# Patient Record
Sex: Male | Born: 1950 | Race: Black or African American | Hispanic: No | Marital: Single | Smoking: Current every day smoker
Health system: Southern US, Community
[De-identification: ages and names within clinical notes are randomized; demographics above are authoritative.]

## PROBLEM LIST (undated history)

## (undated) DIAGNOSIS — I219 Acute myocardial infarction, unspecified: Secondary | ICD-10-CM

## (undated) DIAGNOSIS — F418 Other specified anxiety disorders: Secondary | ICD-10-CM

## (undated) DIAGNOSIS — I739 Peripheral vascular disease, unspecified: Secondary | ICD-10-CM

## (undated) DIAGNOSIS — G5600 Carpal tunnel syndrome, unspecified upper limb: Secondary | ICD-10-CM

## (undated) DIAGNOSIS — M549 Dorsalgia, unspecified: Secondary | ICD-10-CM

## (undated) DIAGNOSIS — F1010 Alcohol abuse, uncomplicated: Secondary | ICD-10-CM

## (undated) HISTORY — PX: BACK SURGERY: SHX140

---

## 1991-12-21 HISTORY — PX: FEMORAL OSTEOTOMY W/ RODDING: SHX1588

## 2003-03-23 ENCOUNTER — Inpatient Hospital Stay (HOSPITAL_COMMUNITY): Admission: EM | Admit: 2003-03-23 | Discharge: 2003-03-23 | Payer: Self-pay | Admitting: Emergency Medicine

## 2003-03-23 ENCOUNTER — Encounter: Payer: Self-pay | Admitting: Emergency Medicine

## 2004-05-27 ENCOUNTER — Ambulatory Visit (HOSPITAL_COMMUNITY): Admission: RE | Admit: 2004-05-27 | Discharge: 2004-05-27 | Payer: Self-pay | Admitting: Internal Medicine

## 2004-09-04 ENCOUNTER — Ambulatory Visit: Payer: Self-pay | Admitting: Nurse Practitioner

## 2004-12-31 ENCOUNTER — Ambulatory Visit: Payer: Self-pay | Admitting: Nurse Practitioner

## 2005-08-10 ENCOUNTER — Ambulatory Visit: Payer: Self-pay | Admitting: Nurse Practitioner

## 2005-09-08 ENCOUNTER — Ambulatory Visit: Payer: Self-pay | Admitting: Nurse Practitioner

## 2005-10-12 ENCOUNTER — Ambulatory Visit: Payer: Self-pay | Admitting: Internal Medicine

## 2005-10-19 ENCOUNTER — Ambulatory Visit: Payer: Self-pay | Admitting: Nurse Practitioner

## 2005-10-28 ENCOUNTER — Emergency Department (HOSPITAL_COMMUNITY): Admission: EM | Admit: 2005-10-28 | Discharge: 2005-10-28 | Payer: Self-pay | Admitting: Emergency Medicine

## 2005-11-24 ENCOUNTER — Ambulatory Visit: Payer: Self-pay | Admitting: Nurse Practitioner

## 2006-01-04 ENCOUNTER — Ambulatory Visit: Payer: Self-pay | Admitting: Nurse Practitioner

## 2006-02-15 ENCOUNTER — Ambulatory Visit: Payer: Self-pay | Admitting: Nurse Practitioner

## 2006-03-16 ENCOUNTER — Ambulatory Visit: Payer: Self-pay | Admitting: Nurse Practitioner

## 2006-04-27 ENCOUNTER — Ambulatory Visit: Payer: Self-pay | Admitting: Nurse Practitioner

## 2006-04-29 ENCOUNTER — Ambulatory Visit: Payer: Self-pay | Admitting: Nurse Practitioner

## 2006-05-26 ENCOUNTER — Ambulatory Visit: Payer: Self-pay | Admitting: Nurse Practitioner

## 2006-08-10 ENCOUNTER — Ambulatory Visit: Payer: Self-pay | Admitting: Internal Medicine

## 2006-08-12 ENCOUNTER — Ambulatory Visit: Payer: Self-pay | Admitting: Nurse Practitioner

## 2006-08-31 ENCOUNTER — Ambulatory Visit: Payer: Self-pay | Admitting: Nurse Practitioner

## 2007-08-12 ENCOUNTER — Inpatient Hospital Stay (HOSPITAL_COMMUNITY): Admission: EM | Admit: 2007-08-12 | Discharge: 2007-08-17 | Payer: Self-pay | Admitting: Emergency Medicine

## 2007-10-18 ENCOUNTER — Ambulatory Visit: Payer: Self-pay | Admitting: Internal Medicine

## 2007-11-10 ENCOUNTER — Ambulatory Visit: Payer: Self-pay | Admitting: Family Medicine

## 2007-12-13 ENCOUNTER — Ambulatory Visit: Payer: Self-pay | Admitting: Internal Medicine

## 2010-04-22 ENCOUNTER — Emergency Department (HOSPITAL_COMMUNITY): Admission: EM | Admit: 2010-04-22 | Discharge: 2010-04-22 | Payer: Self-pay | Admitting: Emergency Medicine

## 2010-04-28 ENCOUNTER — Emergency Department (HOSPITAL_COMMUNITY): Admission: EM | Admit: 2010-04-28 | Discharge: 2010-04-28 | Payer: Self-pay | Admitting: Emergency Medicine

## 2010-04-29 ENCOUNTER — Ambulatory Visit: Payer: Self-pay | Admitting: Internal Medicine

## 2010-06-08 ENCOUNTER — Ambulatory Visit: Payer: Self-pay | Admitting: Family Medicine

## 2010-07-27 ENCOUNTER — Emergency Department (HOSPITAL_COMMUNITY): Admission: EM | Admit: 2010-07-27 | Discharge: 2010-07-27 | Payer: Self-pay | Admitting: Emergency Medicine

## 2011-03-05 LAB — COMPREHENSIVE METABOLIC PANEL
AST: 116 U/L — ABNORMAL HIGH (ref 0–37)
CO2: 25 mEq/L (ref 19–32)
Creatinine, Ser: 1.28 mg/dL (ref 0.4–1.5)
GFR calc non Af Amer: 58 mL/min — ABNORMAL LOW (ref >60)
Glucose, Bld: 108 mg/dL — ABNORMAL HIGH (ref 70–99)
Potassium: 3.8 mEq/L (ref 3.5–5.1)
Sodium: 136 mEq/L (ref 135–145)
Total Protein: 7.3 g/dL (ref 6.0–8.3)

## 2011-03-05 LAB — CBC
HCT: 42 % (ref 39.0–52.0)
MCH: 37.5 pg — ABNORMAL HIGH (ref 26.0–34.0)
MCHC: 34.7 g/dL (ref 30.0–36.0)
RBC: 3.88 MIL/uL — ABNORMAL LOW (ref 4.22–5.81)
WBC: 4.3 10*3/uL (ref 4.0–10.5)

## 2011-03-05 LAB — POCT CARDIAC MARKERS: Troponin i, poc: <0.05 ng/mL (ref 0.00–0.09)

## 2011-03-09 LAB — POCT CARDIAC MARKERS
CKMB, poc: 1.1 ng/mL (ref 1.0–8.0)
Myoglobin, poc: 115 ng/mL (ref 12–200)
Troponin i, poc: <0.05 ng/mL (ref 0.00–0.09)

## 2011-03-09 LAB — POCT I-STAT, CHEM 8
Calcium, Ion: 1.14 mmol/L (ref 1.12–1.32)
Hemoglobin: 14.6 g/dL (ref 13.0–17.0)
Potassium: 3.9 mEq/L (ref 3.5–5.1)
Sodium: 144 mEq/L (ref 135–145)
TCO2: 25 mmol/L (ref 0–100)

## 2011-03-09 LAB — D-DIMER, QUANTITATIVE: D-Dimer, Quant: 1.63 ug/mL-FEU — ABNORMAL HIGH (ref 0.00–0.48)

## 2011-05-04 NOTE — Discharge Summary (Signed)
NAMEAUSTAN, Justin Long NO.:  [id number]   MEDICAL RECORD NO.:  [id number]          PATIENT TYPE:  INP   LOCATION:  1530                         FACILITY:  [facility]   PHYSICIAN:  Justin Long, M.D.   DATE OF BIRTH:  1950-12-21   DATE OF ADMISSION:  08/12/2007  DATE OF DISCHARGE:  08/17/2007                               DISCHARGE SUMMARY   DISCHARGE DIAGNOSES:  1. Abdominal pain, etiology unknown, suspect low-grade pancreatitis.  2. History of ethanol abuse.  3. Hyperamylasemia.  4. Radiographic evidence and biochemical evidence of advanced liver      disease.  5. Cholelithiasis, probably asymptomatic.  6. Left inguinal hernia (fat only).   CONSULTATIONS:  1. Justin Long, gastroenterology.  2. Justin Long, psychiatry.   COMPLICATIONS:  None.   PROCEDURE:  CT scan of abdomen and pelvis.   LABORATORY DATA:  Stool hemoccult negative.  Admission white count was  normal at 5500 with unremarkable differential except for mild elevation  of monocytes (18%), hemoglobin 15 initially and 13.3 prior to discharge,  MCV 108, RDW normal a 13, platelets 81,000 on admission and 71,000 on  repeat.  Chemistry panel pertinent for glucose of 104, BUN 6, creatinine  1.1.  Liver chemistries on admission were elevated with bilirubin of  2.2, AST 103, ALT 81, Alk phos normal at 92.  On repeat, bilirubin was  1.9, AST 86, ALT 66 and albumin after hydration was 2.4.  Admission  amylase was 158 and rose to 354 and stayed around that level on repeat  exam.  Lipase was repeatedly normal throughout the hospitalization, 32  on admission, 26 on repeat.  Urinalysis was basically clear except for  trace hemoglobin and slight protein.   HOSPITAL COURSE:  Justin Long presented to the emergency room with  several days of abdominal pain, following a perhaps heavier than normal  weekend of drinking.  In the emergency room, he is found to have  gallstones on the CT scan and surgery was  thus asked to admit the  patient.  Dr. Marcille Long admitted the patient and consulted [place].   Early during the course of the patient's hospitalization, it became  clear that his symptoms were not really compatible with active biliary  tract disease and it was felt to be a nonsurgical condition, so the  patient was transferred to the GI service for the remainder of his  hospital stay.   The patient's pain was controlled by IV narcotics and bowel rest.  Over  the next several days, the patient was able to tolerate a clear liquid  diet and then a low-fat diet and there was a decrease in his pain  medication requirement.  Prior to discharge, his abdomen was nontender,  his abdominal pain was much better and he was getting by with low doses  of oral narcotic medication, so discharge home was felt to be  appropriate.   The patient showed biochemical evidence of alcoholic hepatitis while in  the hospital, but this did not appear to cause any active problem.  The  same is true for  the radiographic evidence of cirrhosis and portal  hypertension.   The patient was seen in consultation by Dr. Jeanie Long to address  substance abuse and specifically alcohol counseling.  At this time, the  patient did not see himself as having a significant alcohol problem and  thus declined entry into and a rehabilitation program.   Early in the hospitalization, the patient was somewhat jittery and there  was a concern that he might be headed for DTs, so he was started on an  Ativan withdrawal protocol which worked very effectively, so no full-  blown alcohol abstinence syndrome developed.   DISPOSITION:  The patient is discharged home.  He will follow up with  the [facility] for continuing medical care.  I have also  asked him to make an appointment with me for about a month from now to  discuss a possible screening colonoscopy and to monitor his overall  condition.  As of this time, the patient  should be considered unassigned  from the GI tract standpoint, but if indeed the patient follows up as an  outpatient at my office, I would be happy to take him on as a continuing  care patient.   DIET:  The patient was instructed to consume a low-fat diet with  frequent small meals until his abdomen is feeling better.  He was  strongly encouraged to avoid alcohol for fear of both re-exacerbating  his abdominal pain (?pancreatitis) as well as causing further damage to  his liver.   ACTIVITY:  The patient may resume normal activity by gradual increases  in his activity level.   FOLLOW UP:  The patient was instructed to follow up at both [facility] and my office.  He was made aware of the fact that there  is evidence of significant liver damage from alcohol and that he thus  needs to avoid alcohol in the future.  He is also made aware of the fact  he has a small left inguinal hernia in case he starts developing pain or  symptoms in that area, in which case he would need to report that to his  primary physician.  The rationale for colon cancer screening was  discussed with the patient and he was thus encouraged to follow up in my  office for that purpose.  Smoking cessation was encouraged.  He was on a  nicotine patch while in the hospital any showed some interest in  continuing that as an outpatient post discharge.  He was informed that  this is available over-the-counter.   DISCHARGE MEDICATIONS:  1. Prilosec OTC 20 mg once daily for the next 2 weeks (instructed to      purchase over-the-counter).  2. Dilaudid 2 mg one tablet every 6 hours as needed for pain (#10, no      refills).  3. Ativan 1 mg two tablets today, two tablets tomorrow and one tablet      the following day to complete alcohol withdrawal prophylaxis.   CONDITION ON DISCHARGE:  Improved.           ______________________________  Justin Long, M.D.    RB/MEDQ  D:  08/17/2007  T:  08/18/2007   Job:  [id number]   cc:   Emeline Darling, M.D.  [facility]  [facility] [employer] [facility] [employer]
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
## 2011-05-04 NOTE — H&P (Signed)
Justin Long, Justin Long NO.:  [id number]   MEDICAL RECORD NO.:  [id number]          PATIENT TYPE:  EMS   LOCATION:  ED                           FACILITY:  [facility]   PHYSICIAN:  Wilmon Arms. Corliss Skains, M.D. DATE OF BIRTH:  07-May-1951   DATE OF ADMISSION:  08/12/2007  DATE OF DISCHARGE:                              HISTORY & PHYSICAL   CHIEF COMPLAINT:  Upper abdominal pain and distention x3 days.   HISTORY OF PRESENT ILLNESS:  The patient is a 60 year old black male who  presents with a three-day history of worsening generalized upper  abdominal pain.  He has had one episode of nausea and vomiting.  He took  some Pepto-Bismol and some of sodium bicarbonate to try to help him have  bowel movements.  He had four bowel movements with no improvement in his  pain.  The patient is hungry but states that he has not eaten much in  the last couple of days.  He denies any previous episodes of similar  symptoms.  His diarrhea had no sign of blood.  His emesis had no  bleeding.  He presents to the emergency department for evaluation.  He  was noted to be distended and on his plain films he had a large number  of gallstones as well as diffuse ileus.  We have been consulted for  evaluation.   PAST MEDICAL HISTORY:  None.   PAST SURGICAL HISTORY:  1. Back surgery x2.  2. Right leg surgery.   FAMILY HISTORY:  Noncontributory.   SOCIAL HISTORY:  The patient smokes a half to a pack a day and drinks  heavily on the weekends but not during the week.   ALLERGIES:  PENICILLIN.   MEDICATIONS:  Pepto-Bismol p.r.n.   PHYSICAL EXAMINATION:  VITAL SIGNS:  Temperature 97.7, pulse 69,  respirations 20, blood pressure 119/58.  GENERAL: This is a well-developed, well-nourished male in no apparent  distress.  HEENT:  EOMI.  Sclerae anicteric.  NECK:  No mass, no thyromegaly.  LUNGS: Clear.  Normal respiratory effort.  HEART:  Regular rate and rhythm.  No murmur.  ABDOMEN:  Distended,  occasional hypoactive bowel sounds, tender mostly  in the epigastrium as well as both right and left upper quadrants.  No  sign of umbilical hernia.  No lower abdominal tenderness.  The patient  does have a reducible left inguinal hernia which has been present for  several years.  GU:  Bilateral descended testes.  No tenderness.  EXTREMITIES:  No edema.  SKIN:  Warm and dry with no sign of jaundice.   LABORATORY DATA:  White count 5.5, hemoglobin 15.  Potassium 3.1. Total  bilirubin 2.2, AST 103, ALT 81, lipase 32, amylase 158.   Acute abdominal series showed a mild colonic and small bowel ileus and  cholelithiasis.  Ultrasound showed cholelithiasis but no sign of  gallbladder wall thickening and no pericholecystic fluid.   IMPRESSION:  1. Diffuse small-bowel and colonic ileus of unclear etiology.  2. Cholelithiasis which may or may not be contributing to his ileus.  3. Reducible left  inguinal hernia which does not appear to be causing      problem at this time.   PLAN:  We will admit the patient for pain management.  We will keep  n.p.o. and hydrate him.  We will obtain a CT scan of the abdomen and  pelvis to give [place] more information regarding the possible etiology of  his ileus.      Wilmon Arms. Tsuei, M.D.  Electronically Signed     MKT/MEDQ  D:  08/12/2007  T:  08/13/2007  Job:  [id number]
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
## 2011-10-01 LAB — CBC
HCT: 38.1 — ABNORMAL LOW
HCT: 43.3
Hemoglobin: 13.4
MCHC: 34.7
MCHC: 35
MCHC: 35.1
MCV: 107.7 — ABNORMAL HIGH
Platelets: 81 — ABNORMAL LOW
RBC: 3.55 — ABNORMAL LOW
RBC: 4.02 — ABNORMAL LOW
RDW: 12.9
WBC: 4.7
WBC: 5.5

## 2011-10-01 LAB — URINALYSIS, ROUTINE W REFLEX MICROSCOPIC
Leukocytes, UA: NEGATIVE
Specific Gravity, Urine: 1.016
Urobilinogen, UA: 0.2

## 2011-10-01 LAB — DIFFERENTIAL
Basophils Relative: 0
Eosinophils Relative: 1
Monocytes Absolute: 1 — ABNORMAL HIGH
Neutro Abs: 2.9

## 2011-10-01 LAB — COMPREHENSIVE METABOLIC PANEL
ALT: 76 — ABNORMAL HIGH
ALT: 81 — ABNORMAL HIGH
AST: 103 — ABNORMAL HIGH
AST: 98 — ABNORMAL HIGH
Alkaline Phosphatase: 92
BUN: 6
BUN: 7
CO2: 23
Calcium: 8 — ABNORMAL LOW
Calcium: 8.2 — ABNORMAL LOW
Chloride: 106
Chloride: 108
Creatinine, Ser: 1.11
Creatinine, Ser: 1.11
GFR calc Af Amer: >60
GFR calc non Af Amer: >60
Glucose, Bld: 114 — ABNORMAL HIGH
Potassium: 3.1 — ABNORMAL LOW
Sodium: 134 — ABNORMAL LOW
Sodium: 140
Total Protein: 6.3

## 2011-10-01 LAB — AMYLASE
Amylase: 292 — ABNORMAL HIGH
Amylase: 354 — ABNORMAL HIGH

## 2011-10-01 LAB — LIPASE, BLOOD: Lipase: 24

## 2011-12-26 ENCOUNTER — Encounter: Payer: Self-pay | Admitting: *Deleted

## 2011-12-26 ENCOUNTER — Emergency Department (HOSPITAL_COMMUNITY): Payer: Medicare Other

## 2011-12-26 ENCOUNTER — Other Ambulatory Visit: Payer: Self-pay

## 2011-12-26 ENCOUNTER — Emergency Department (HOSPITAL_COMMUNITY)
Admission: EM | Admit: 2011-12-26 | Discharge: 2011-12-27 | Disposition: A | Discharge status: Home Health | Payer: Medicare Other | Attending: Emergency Medicine | Admitting: Emergency Medicine

## 2011-12-26 DIAGNOSIS — Z79899 Other long term (current) drug therapy: Secondary | ICD-10-CM | POA: Insufficient documentation

## 2011-12-26 DIAGNOSIS — J40 Bronchitis, not specified as acute or chronic: Secondary | ICD-10-CM

## 2011-12-26 DIAGNOSIS — M549 Dorsalgia, unspecified: Secondary | ICD-10-CM | POA: Insufficient documentation

## 2011-12-26 DIAGNOSIS — F1410 Cocaine abuse, uncomplicated: Secondary | ICD-10-CM

## 2011-12-26 DIAGNOSIS — F1010 Alcohol abuse, uncomplicated: Secondary | ICD-10-CM

## 2011-12-26 DIAGNOSIS — Z7982 Long term (current) use of aspirin: Secondary | ICD-10-CM | POA: Insufficient documentation

## 2011-12-26 DIAGNOSIS — G8929 Other chronic pain: Secondary | ICD-10-CM | POA: Insufficient documentation

## 2011-12-26 DIAGNOSIS — R059 Cough, unspecified: Secondary | ICD-10-CM | POA: Insufficient documentation

## 2011-12-26 DIAGNOSIS — M79609 Pain in unspecified limb: Secondary | ICD-10-CM | POA: Insufficient documentation

## 2011-12-26 DIAGNOSIS — R093 Abnormal sputum: Secondary | ICD-10-CM | POA: Insufficient documentation

## 2011-12-26 DIAGNOSIS — R05 Cough: Secondary | ICD-10-CM | POA: Insufficient documentation

## 2011-12-26 DIAGNOSIS — F17200 Nicotine dependence, unspecified, uncomplicated: Secondary | ICD-10-CM | POA: Insufficient documentation

## 2011-12-26 DIAGNOSIS — R252 Cramp and spasm: Secondary | ICD-10-CM | POA: Insufficient documentation

## 2011-12-26 HISTORY — DX: Alcohol abuse, uncomplicated: F10.10

## 2011-12-26 LAB — RAPID URINE DRUG SCREEN, HOSP PERFORMED
Amphetamines: NOT DETECTED
Benzodiazepines: NOT DETECTED
Opiates: NOT DETECTED

## 2011-12-26 LAB — ETHANOL: Alcohol, Ethyl (B): <11 mg/dL (ref 0–11)

## 2011-12-26 MED ORDER — IBUPROFEN 800 MG PO TABS
800.0000 mg | ORAL_TABLET | Freq: Once | ORAL | Status: AC
  Administered 2011-12-26: 800 mg via ORAL
  Filled 2011-12-26: qty 1

## 2011-12-26 NOTE — ED Notes (Signed)
Patient is resting comfortably. 

## 2011-12-26 NOTE — ED Notes (Signed)
The pt is c/o epigastric pain for 5-6 days with some rt arm pain intermittently.  No pain at present.  He admits to drinking alcohol

## 2011-12-26 NOTE — ED Notes (Signed)
Patient given sprite and graham crackers to eat.  Tolerated well

## 2011-12-26 NOTE — ED Provider Notes (Signed)
History     CSN: [id number]  Arrival date & time 12/26/11  9147   First MD Initiated Contact with Patient 12/26/11 2023      Chief Complaint  Patient presents with  . Abdominal Pain    (Consider location/radiation/quality/duration/timing/severity/associated sxs/prior treatment) Patient is a 61 y.o. male presenting with abdominal pain. The history is provided by the patient.  Abdominal Pain The primary symptoms of the illness do not include fever, shortness of breath, nausea or vomiting.  Additional symptoms associated with the illness include back pain. Symptoms associated with the illness do not include chills.   the patient is a 61 year old alcoholic and drug user.  He presents to emergency department complaining of a productive cough with green sputum and right-sided chest pain.  He's also says that he has right leg pain.  He says when he goes for a walk.  He gets a crampy sensation in his right leg.  He denies chest pain, shortness breath, nausea, vomiting, or diaphoresis.  When he goes for walks.  He has not had a fever, or chills.  He also states that he wants to treatment for drugs and alcohol  Past Medical History  Diagnosis Date  . Alcohol abuse     History reviewed. No pertinent past surgical history.  History reviewed. No pertinent family history.  History  Substance Use Topics  . Smoking status: Current Everyday Smoker  . Smokeless tobacco: Not on file  . Alcohol Use: Yes      Review of Systems  Constitutional: Negative for fever and chills.  HENT: Negative for congestion.   Respiratory: Positive for cough. Negative for chest tightness and shortness of breath.   Cardiovascular: Negative for chest pain and leg swelling.       He says he gets cramping in his legs.  When he walks.  Since he smokes cigarettes.  I suspect that he has claudication.  Gastrointestinal: Negative for nausea and vomiting.  Musculoskeletal: Positive for back pain.       Chronic back  pain, and chronic right leg pain.  He states he has rods in both his back, and his right leg.  Skin: Negative for rash.    Allergies  Penicillins  Home Medications   Current Outpatient Rx  Name Route Sig Dispense Refill  . ASPIRIN 325 MG PO TABS Oral Take 325 mg by mouth daily.      Marland Kitchen FLUOXETINE HCL 10 MG PO CAPS Oral Take 10 mg by mouth daily.      . IBUPROFEN 200 MG PO TABS Oral Take 400 mg by mouth daily as needed. For pain     . PAMELOR PO Oral Take 1 tablet by mouth daily.        BP 139/80  Pulse 89  Temp(Src) 97.9 F (36.6 C) (Oral)  Resp 24  SpO2 98%  Physical Exam  Vitals reviewed. Constitutional: He is oriented to person, place, and time. He appears well-developed and well-nourished. No distress.  HENT:  Head: Normocephalic and atraumatic.  Eyes: EOM are normal. Pupils are equal, round, and reactive to light.  Neck: Normal range of motion. Neck supple.  Cardiovascular: Normal rate, regular rhythm and normal heart sounds.   No murmur heard.      Decreased right foot.  Dorsalis pedis and posterior tibial pulses.  They are undetectable by palpation however, with bedside Doppler, they are audible.  Pulmonary/Chest: Effort normal and breath sounds normal. No respiratory distress. He has no wheezes. He has no rales.  Abdominal: Soft. Bowel sounds are normal. He exhibits no distension and no mass. There is no tenderness. There is no rebound and no guarding.  Musculoskeletal: Normal range of motion. He exhibits no edema and no tenderness.  Neurological: He is alert and oriented to person, place, and time. No cranial nerve deficit.  Skin: Skin is warm and dry. He is not diaphoretic.  Psychiatric: He has a normal mood and affect. His behavior is normal.    ED Course  Procedures (including critical care time) 61 year old, male, with chronic back pain, and leg pain, presents with a productive cough, and symptoms consistent with claudication in his right leg.  We'll perform a  chest x-ray, and laboratory testing for drug and alcohol.  Use since she is requesting treatment for this.   Labs Reviewed  ETHANOL  URINE RAPID DRUG SCREEN (HOSP PERFORMED)   No results found.   No diagnosis found.    MDM  Bronchitis- no pneumonia or respiratory distress. Cocaine abuse Alcoholism.  No signs of withdrawal at this time.        Nicholes Stairs, MD 12/26/11 2356

## 2012-03-03 ENCOUNTER — Encounter (HOSPITAL_COMMUNITY)
Admission: EM | Disposition: A | Discharge status: Home / Self Care | Payer: Self-pay | Source: Home / Self Care | Attending: Cardiology

## 2012-03-03 ENCOUNTER — Other Ambulatory Visit: Payer: Self-pay

## 2012-03-03 ENCOUNTER — Encounter (HOSPITAL_COMMUNITY): Payer: Self-pay | Admitting: Emergency Medicine

## 2012-03-03 ENCOUNTER — Inpatient Hospital Stay (HOSPITAL_COMMUNITY)
Admission: EM | Admit: 2012-03-03 | Discharge: 2012-03-07 | DRG: 249 | Disposition: A | Discharge status: Home / Self Care | Payer: Medicare Other | Attending: Cardiology | Admitting: Cardiology

## 2012-03-03 ENCOUNTER — Emergency Department (HOSPITAL_COMMUNITY): Payer: Medicare Other

## 2012-03-03 DIAGNOSIS — F419 Anxiety disorder, unspecified: Secondary | ICD-10-CM | POA: Insufficient documentation

## 2012-03-03 DIAGNOSIS — Z9861 Coronary angioplasty status: Secondary | ICD-10-CM

## 2012-03-03 DIAGNOSIS — Z7982 Long term (current) use of aspirin: Secondary | ICD-10-CM

## 2012-03-03 DIAGNOSIS — F1410 Cocaine abuse, uncomplicated: Secondary | ICD-10-CM | POA: Diagnosis present

## 2012-03-03 DIAGNOSIS — F17200 Nicotine dependence, unspecified, uncomplicated: Secondary | ICD-10-CM | POA: Diagnosis present

## 2012-03-03 DIAGNOSIS — I70219 Atherosclerosis of native arteries of extremities with intermittent claudication, unspecified extremity: Secondary | ICD-10-CM | POA: Diagnosis present

## 2012-03-03 DIAGNOSIS — I2510 Atherosclerotic heart disease of native coronary artery without angina pectoris: Secondary | ICD-10-CM | POA: Diagnosis present

## 2012-03-03 DIAGNOSIS — I219 Acute myocardial infarction, unspecified: Secondary | ICD-10-CM

## 2012-03-03 DIAGNOSIS — I252 Old myocardial infarction: Secondary | ICD-10-CM

## 2012-03-03 DIAGNOSIS — I2129 ST elevation (STEMI) myocardial infarction involving other sites: Principal | ICD-10-CM | POA: Diagnosis present

## 2012-03-03 DIAGNOSIS — M19019 Primary osteoarthritis, unspecified shoulder: Secondary | ICD-10-CM | POA: Diagnosis present

## 2012-03-03 DIAGNOSIS — R079 Chest pain, unspecified: Secondary | ICD-10-CM | POA: Diagnosis present

## 2012-03-03 DIAGNOSIS — D696 Thrombocytopenia, unspecified: Secondary | ICD-10-CM | POA: Diagnosis present

## 2012-03-03 DIAGNOSIS — F1210 Cannabis abuse, uncomplicated: Secondary | ICD-10-CM | POA: Diagnosis present

## 2012-03-03 DIAGNOSIS — I739 Peripheral vascular disease, unspecified: Secondary | ICD-10-CM

## 2012-03-03 DIAGNOSIS — G5600 Carpal tunnel syndrome, unspecified upper limb: Secondary | ICD-10-CM

## 2012-03-03 DIAGNOSIS — F1910 Other psychoactive substance abuse, uncomplicated: Secondary | ICD-10-CM

## 2012-03-03 HISTORY — DX: Acute myocardial infarction, unspecified: I21.9

## 2012-03-03 HISTORY — DX: Carpal tunnel syndrome, unspecified upper limb: G56.00

## 2012-03-03 HISTORY — DX: Peripheral vascular disease, unspecified: I73.9

## 2012-03-03 HISTORY — DX: Other specified anxiety disorders: F41.8

## 2012-03-03 HISTORY — DX: Dorsalgia, unspecified: M54.9

## 2012-03-03 LAB — CARDIAC PANEL(CRET KIN+CKTOT+MB+TROPI)
CK, MB: 138.7 ng/mL (ref 0.3–4.0)
Relative Index: 6.9 — ABNORMAL HIGH (ref 0.0–2.5)
Total CK: 1650 U/L — ABNORMAL HIGH (ref 7–232)
Troponin I: 8.96 ng/mL (ref <0.30)
Troponin I: 20.71 ng/mL (ref <0.30)

## 2012-03-03 LAB — COMPREHENSIVE METABOLIC PANEL
ALT: 40 U/L (ref 0–53)
AST: 112 U/L — ABNORMAL HIGH (ref 0–37)
CO2: 24 mEq/L (ref 19–32)
Calcium: 8.7 mg/dL (ref 8.4–10.5)
Chloride: 106 mEq/L (ref 96–112)
GFR calc Af Amer: >90 mL/min (ref >90)
GFR calc non Af Amer: 88 mL/min — ABNORMAL LOW (ref >90)
Glucose, Bld: 106 mg/dL — ABNORMAL HIGH (ref 70–99)
Sodium: 138 mEq/L (ref 135–145)
Total Bilirubin: 1.8 mg/dL — ABNORMAL HIGH (ref 0.3–1.2)

## 2012-03-03 LAB — DIFFERENTIAL
Basophils Relative: 0 % (ref 0–1)
Eosinophils Relative: 0 % (ref 0–5)
Lymphs Abs: 0.8 10*3/uL (ref 0.7–4.0)
Monocytes Absolute: 0.7 10*3/uL (ref 0.1–1.0)
Monocytes Relative: 15 % — ABNORMAL HIGH (ref 3–12)
Neutrophils Relative %: 68 % (ref 43–77)

## 2012-03-03 LAB — DRUGS OF ABUSE SCREEN W/O ALC, ROUTINE URINE
Cocaine Metabolites: POSITIVE — AB
Creatinine,U: 121.9 mg/dL
Marijuana Metabolite: NEGATIVE
Methadone: NEGATIVE
Opiate Screen, Urine: NEGATIVE
Propoxyphene: NEGATIVE

## 2012-03-03 LAB — HEPARIN LEVEL (UNFRACTIONATED): Heparin Unfractionated: 0.23 IU/mL — ABNORMAL LOW (ref 0.30–0.70)

## 2012-03-03 LAB — CBC
Hemoglobin: 12.8 g/dL — ABNORMAL LOW (ref 13.0–17.0)
MCH: 36.5 pg — ABNORMAL HIGH (ref 26.0–34.0)
MCV: 98.9 fL (ref 78.0–100.0)
RBC: 3.51 MIL/uL — ABNORMAL LOW (ref 4.22–5.81)
WBC: 4.9 10*3/uL (ref 4.0–10.5)

## 2012-03-03 LAB — APTT: aPTT: 39 seconds — ABNORMAL HIGH (ref 24–37)

## 2012-03-03 LAB — PROTIME-INR
INR: 1.48 (ref 0.00–1.49)
Prothrombin Time: 18.2 seconds — ABNORMAL HIGH (ref 11.6–15.2)

## 2012-03-03 LAB — ETHANOL: Alcohol, Ethyl (B): <11 mg/dL (ref 0–11)

## 2012-03-03 SURGERY — LEFT HEART CATHETERIZATION WITH CORONARY ANGIOGRAM
Anesthesia: LOCAL

## 2012-03-03 MED ORDER — HYDROMORPHONE HCL PF 1 MG/ML IJ SOLN
1.0000 mg | Freq: Once | INTRAMUSCULAR | Status: AC
  Administered 2012-03-03: 1 mg via INTRAVENOUS
  Filled 2012-03-03: qty 1

## 2012-03-03 MED ORDER — ONDANSETRON HCL 4 MG/2ML IJ SOLN: 4.0000 mg | Freq: Four times a day (QID) | INTRAMUSCULAR | Status: DC | PRN

## 2012-03-03 MED ORDER — LORAZEPAM 0.5 MG PO TABS
1.0000 mg | ORAL_TABLET | Freq: Four times a day (QID) | ORAL | Status: AC | PRN
  Administered 2012-03-04 – 2012-03-05 (×3): 1 mg via ORAL
  Filled 2012-03-03 (×4): qty 1

## 2012-03-03 MED ORDER — NITROGLYCERIN IN D5W 200-5 MCG/ML-% IV SOLN: 5.0000 ug/min | INTRAVENOUS | Status: DC

## 2012-03-03 MED ORDER — FOLIC ACID 1 MG PO TABS
1.0000 mg | ORAL_TABLET | Freq: Every day | ORAL | Status: DC
  Administered 2012-03-03 – 2012-03-07 (×5): 1 mg via ORAL
  Filled 2012-03-03 (×5): qty 1

## 2012-03-03 MED ORDER — ASPIRIN 81 MG PO CHEW: 324.0000 mg | CHEWABLE_TABLET | ORAL | Status: AC

## 2012-03-03 MED ORDER — VITAMIN B-1 100 MG PO TABS
100.0000 mg | ORAL_TABLET | Freq: Every day | ORAL | Status: DC
  Administered 2012-03-03 – 2012-03-07 (×5): 100 mg via ORAL
  Filled 2012-03-03 (×5): qty 1

## 2012-03-03 MED ORDER — NITROGLYCERIN IN D5W 200-5 MCG/ML-% IV SOLN
2.0000 ug/min | Freq: Once | INTRAVENOUS | Status: AC
  Administered 2012-03-03: 5 ug/min via INTRAVENOUS
  Filled 2012-03-03: qty 250

## 2012-03-03 MED ORDER — LORAZEPAM 2 MG/ML IJ SOLN: 0.0000 mg | Freq: Four times a day (QID) | INTRAMUSCULAR | Status: AC

## 2012-03-03 MED ORDER — OXYCODONE-ACETAMINOPHEN 5-325 MG PO TABS: 1.0000 | ORAL_TABLET | Freq: Four times a day (QID) | ORAL | Status: DC | PRN

## 2012-03-03 MED ORDER — PANTOPRAZOLE SODIUM 40 MG PO TBEC
40.0000 mg | DELAYED_RELEASE_TABLET | Freq: Once | ORAL | Status: AC
  Administered 2012-03-03: 40 mg via ORAL
  Filled 2012-03-03: qty 1

## 2012-03-03 MED ORDER — ASPIRIN EC 81 MG PO TBEC
81.0000 mg | DELAYED_RELEASE_TABLET | Freq: Every day | ORAL | Status: DC
  Administered 2012-03-04 – 2012-03-07 (×3): 81 mg via ORAL
  Filled 2012-03-03 (×4): qty 1

## 2012-03-03 MED ORDER — ASPIRIN 81 MG PO CHEW: 324.0000 mg | CHEWABLE_TABLET | Freq: Once | ORAL | Status: DC

## 2012-03-03 MED ORDER — ATORVASTATIN CALCIUM 20 MG PO TABS
20.0000 mg | ORAL_TABLET | Freq: Every day | ORAL | Status: DC
  Administered 2012-03-03 – 2012-03-07 (×5): 20 mg via ORAL
  Filled 2012-03-03 (×5): qty 1

## 2012-03-03 MED ORDER — SODIUM CHLORIDE 0.9 % IV SOLN: INTRAVENOUS | Status: DC

## 2012-03-03 MED ORDER — PNEUMOCOCCAL VAC POLYVALENT 25 MCG/0.5ML IJ INJ
0.5000 mL | INJECTION | INTRAMUSCULAR | Status: AC
  Administered 2012-03-04: 0.5 mL via INTRAMUSCULAR
  Filled 2012-03-03: qty 0.5

## 2012-03-03 MED ORDER — FAMOTIDINE 20 MG PO TABS
20.0000 mg | ORAL_TABLET | Freq: Once | ORAL | Status: AC
  Administered 2012-03-03: 20 mg via ORAL
  Filled 2012-03-03: qty 1

## 2012-03-03 MED ORDER — ADULT MULTIVITAMIN W/MINERALS CH
1.0000 | ORAL_TABLET | Freq: Every day | ORAL | Status: DC
  Administered 2012-03-03 – 2012-03-07 (×5): 1 via ORAL
  Filled 2012-03-03 (×5): qty 1

## 2012-03-03 MED ORDER — SODIUM CHLORIDE 0.9 % IV SOLN
INTRAVENOUS | Status: DC
  Administered 2012-03-03: 08:00:00 via INTRAVENOUS

## 2012-03-03 MED ORDER — ONDANSETRON HCL 4 MG/2ML IJ SOLN
4.0000 mg | Freq: Once | INTRAMUSCULAR | Status: AC
  Administered 2012-03-03: 4 mg via INTRAVENOUS
  Filled 2012-03-03: qty 2

## 2012-03-03 MED ORDER — HEPARIN (PORCINE) IN NACL 100-0.45 UNIT/ML-% IJ SOLN
1700.0000 [IU]/h | INTRAMUSCULAR | Status: DC
  Administered 2012-03-03: 1450 [IU]/h via INTRAVENOUS
  Administered 2012-03-03: 1600 [IU]/h via INTRAVENOUS
  Administered 2012-03-04 – 2012-03-06 (×3): 1700 [IU]/h via INTRAVENOUS
  Filled 2012-03-03 (×10): qty 250

## 2012-03-03 MED ORDER — GI COCKTAIL ~~LOC~~
30.0000 mL | Freq: Once | ORAL | Status: AC
  Administered 2012-03-03: 30 mL via ORAL
  Filled 2012-03-03: qty 30

## 2012-03-03 MED ORDER — LORAZEPAM 2 MG/ML IJ SOLN: 0.0000 mg | Freq: Two times a day (BID) | INTRAMUSCULAR | Status: AC

## 2012-03-03 MED ORDER — LORAZEPAM 2 MG/ML IJ SOLN
1.0000 mg | Freq: Once | INTRAMUSCULAR | Status: AC
  Administered 2012-03-03: 1 mg via INTRAVENOUS
  Filled 2012-03-03: qty 1

## 2012-03-03 MED ORDER — NITROGLYCERIN 0.4 MG SL SUBL: 0.4000 mg | SUBLINGUAL_TABLET | SUBLINGUAL | Status: DC | PRN

## 2012-03-03 MED ORDER — THIAMINE HCL 100 MG/ML IJ SOLN
100.0000 mg | Freq: Every day | INTRAMUSCULAR | Status: DC
  Filled 2012-03-03: qty 1

## 2012-03-03 MED ORDER — LORAZEPAM 2 MG/ML IJ SOLN: 1.0000 mg | Freq: Four times a day (QID) | INTRAMUSCULAR | Status: AC | PRN

## 2012-03-03 NOTE — ED Notes (Signed)
Pt called and left msg for the mother of his daughter, Renea Ee, to let her know he is a pt at [facility].

## 2012-03-03 NOTE — ED Notes (Addendum)
Pt brought in via EMS for chest discomfort. Pt states pain started yesterday after 2 bologna sandwiches and an orange. Pt localizes pain in epigastric area and states he was hospitalized in January for same s/s as today, but all test came back negative during that visit. Pt was given 81mg  ASA x4 and SL nitro x2 by EMS prior to arrival to ED.

## 2012-03-03 NOTE — Progress Notes (Signed)
Dr. Graciela Husbands received notification from cath lab that patient is unable to go for cath because he is not consentable at this point (received dilaudid and ativan earlier in the ER). At this time he is chest pain free. We will admit, hold off on cath for now, continue IV NTG. No BB 2/2 cocaine use as previously stated. Per discussion with Dr. Graciela Husbands, will initiate heparin per pharmacy - has chronic thrombocytopenia and has had no problems with bleeding while on ASA at home. Will monitor closely. This plan was conveyed to the patient.  Stassi Fadely PA-C

## 2012-03-03 NOTE — ED Notes (Signed)
Dr.Connor notified of Critical Labs

## 2012-03-03 NOTE — ED Notes (Signed)
Patient transported to X-ray 

## 2012-03-03 NOTE — ED Notes (Signed)
Pt back from XR. Cardiac monitor placed on pt
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
## 2012-03-03 NOTE — Progress Notes (Signed)
Heparin per Pharmacy  Heparin level = 0.24 on 1450 units/hr Goal heparin level =0.3-0.7  Heparin level is subtherapeutic. Will increase rate to 1600 units/hr. Will f/u am heparin level and CBC.  Cardell Peach, Pharm D

## 2012-03-03 NOTE — Progress Notes (Addendum)
ANTICOAGULATION CONSULT NOTE - Initial Consult  Pharmacy Consult:  Heparin Indication:  ACS  Allergies  Allergen Reactions  . Penicillins Swelling and Rash    Patient states only has reaction to liquid penicillins    Patient Measurements: Height: 6' 2.5" (189.2 cm) Weight: 230 lb (104.327 kg) IBW/kg (Calculated) : 83.35  Heparin Dosing Weight: 104 kg  Vital Signs: Temp: 97.3 F (36.3 C) (03/15 0940) Temp src: Oral (03/15 0940) BP: 122/80 mmHg (03/15 1345) Pulse Rate: 65  (03/15 1345)  Labs:  Basename 03/03/12 1301 03/03/12 0920 03/03/12 0919 03/03/12 0755  HGB -- -- -- 12.8*  HCT -- -- -- 34.7*  PLT -- -- -- 60*  APTT -- 39* -- --  LABPROT -- 18.2* -- --  INR -- 1.48 -- --  HEPARINUNFRC -- -- -- --  CREATININE -- -- -- 0.99  CKTOTAL 1650* -- 1031* --  CKMB 138.7* -- 70.7* --  TROPONINI 20.71* -- 8.96* 9.19*   Estimated Creatinine Clearance: 104.3 ml/min (by C-G formula based on Cr of 0.99).  Medical History: Past Medical History  Diagnosis Date  . Alcohol abuse   . Back pain   . Depression with anxiety   . Carpal tunnel syndrome 03/03/2012  . Bilateral claudication of lower limb 03/03/2012  . Myocardial infarction acute question septal 03/03/2012    ST elevation V1 and V2      Assessment: 61 YOM with chest pain and positive cardiac enzymes to start anticoagulation with heparin.  Noted mildly elevated INR in patient with h/o EtOH abuse.  Patient also with h/o thrombocytopenia, plts on admit is 60.  Goal of Therapy:  HL 0.3 - 0.7 units/mL    Plan:  - Heparin gtt at 1450 units/hr, no bolus - Heparin level 6 hrs post gtt started - Daily HL / CBC    Phillips Climes, PharmD 03/03/2012,2:17 PM

## 2012-03-03 NOTE — ED Notes (Signed)
Troponin level reported to Dr.Connor

## 2012-03-04 DIAGNOSIS — I219 Acute myocardial infarction, unspecified: Secondary | ICD-10-CM

## 2012-03-04 LAB — COMPREHENSIVE METABOLIC PANEL
ALT: 47 U/L (ref 0–53)
Alkaline Phosphatase: 139 U/L — ABNORMAL HIGH (ref 39–117)
CO2: 24 mEq/L (ref 19–32)
Calcium: 8.2 mg/dL — ABNORMAL LOW (ref 8.4–10.5)
Chloride: 106 mEq/L (ref 96–112)
GFR calc Af Amer: 84 mL/min — ABNORMAL LOW (ref >90)
GFR calc non Af Amer: 72 mL/min — ABNORMAL LOW (ref >90)
Glucose, Bld: 122 mg/dL — ABNORMAL HIGH (ref 70–99)
Potassium: 3.6 mEq/L (ref 3.5–5.1)
Sodium: 136 mEq/L (ref 135–145)
Total Bilirubin: 1.8 mg/dL — ABNORMAL HIGH (ref 0.3–1.2)

## 2012-03-04 LAB — LIPID PANEL
LDL Cholesterol: 121 mg/dL — ABNORMAL HIGH (ref 0–99)
VLDL: 15 mg/dL (ref 0–40)

## 2012-03-04 LAB — CARDIAC PANEL(CRET KIN+CKTOT+MB+TROPI)
Relative Index: 8.1 — ABNORMAL HIGH (ref 0.0–2.5)
Total CK: 1630 U/L — ABNORMAL HIGH (ref 7–232)
Troponin I: >25 ng/mL (ref <0.30)

## 2012-03-04 LAB — CBC
Hemoglobin: 12.1 g/dL — ABNORMAL LOW (ref 13.0–17.0)
MCH: 36.7 pg — ABNORMAL HIGH (ref 26.0–34.0)
RBC: 3.3 MIL/uL — ABNORMAL LOW (ref 4.22–5.81)
WBC: 5.6 10*3/uL (ref 4.0–10.5)

## 2012-03-04 LAB — HEPARIN LEVEL (UNFRACTIONATED): Heparin Unfractionated: 0.37 IU/mL (ref 0.30–0.70)

## 2012-03-04 NOTE — Progress Notes (Signed)
Patient ID: Justin Long, male   DOB: 1950-12-22, 61 y.o.   MRN: [id number] @ Subjective:  Denies SSCP, palpitations or Dyspnea Much better  Objective:  Filed Vitals:   03/04/12 0105 03/04/12 0400 03/04/12 0741 03/04/12 0800  BP: 133/71 126/72 135/71   Pulse: 73 64 78   Temp:  98.3 F (36.8 C)  98.5 F (36.9 C)  TempSrc:  Oral  Oral  Resp: 17 18 20    Height:      Weight:      SpO2: 97% 96% 98%     Intake/Output from previous day:  Intake/Output Summary (Last 24 hours) at 03/04/12 0943 Last data filed at 03/04/12 0800  Gross per 24 hour  Intake 1038.5 ml  Output   1525 ml  Net -486.5 ml    Physical Exam: Affect appropriate Healthy:  appears stated age HEENT: normal Neck supple with no adenopathy JVP normal no bruits no thyromegaly Lungs clear with no wheezing and good diaphragmatic motion Heart:  S1/S2 no murmur, no rub, gallop or click PMI normal Abdomen: benighn, BS positve, no tenderness, no AAA no bruit.  No HSM or HJR Distal pulses intact with no bruits No edema Neuro non-focal Skin warm and dry No muscular weakness   Lab Results: Basic Metabolic Panel:  Basename 03/04/12 0500 03/03/12 0755  NA 136 138  K 3.6 3.6  CL 106 106  CO2 24 24  GLUCOSE 122* 106*  BUN 6 6  CREATININE 1.09 0.99  CALCIUM 8.2* 8.7  MG -- --  PHOS -- --   Liver Function Tests:  [facility] 03/04/12 0500 03/03/12 0755  AST 195* 112*  ALT 47 40  ALKPHOS 139* 154*  BILITOT 1.8* 1.8*  PROT 6.2 6.8  ALBUMIN 2.0* 2.2*    Basename 03/03/12 0755  LIPASE 27  AMYLASE --   CBC:  Basename 03/04/12 0500 03/03/12 0755  WBC 5.6 4.9  NEUTROABS -- 3.4  HGB 12.1* 12.8*  HCT 33.3* 34.7*  MCV 100.9* 98.9  PLT 53* 60*   Cardiac Enzymes:  Basename 03/03/12 2357 03/03/12 1819 03/03/12 1301  CKTOTAL 1630* 1811* 1650*  CKMB 132.1* 160.7* 138.7*  CKMBINDEX -- -- --  TROPONINI >25.00* >25.00* 20.71*   BNP: No components found with this basename: POCBNP:3 D-Dimer: No results  found for this basename: DDIMER:2 in the last 72 hours Hemoglobin A1C:  Basename 03/03/12 1301  HGBA1C 4.7   Fasting Lipid Panel:  Basename 03/04/12 0500  CHOL 178  HDL 42  LDLCALC 121*  TRIG 75  CHOLHDL 4.2  LDLDIRECT --   Thyroid Function Tests:  Basename 03/03/12 1301  TSH 2.971  T4TOTAL --  T3FREE --  THYROIDAB --   Anemia Panel: No results found for this basename: VITAMINB12,FOLATE,FERRITIN,TIBC,IRON,RETICCTPCT in the last 72 hours  Imaging: Dg Chest 2 View  03/03/2012  *RADIOLOGY REPORT*  Clinical Data: Mid chest pain and shortness of breath.  CHEST - 2 VIEW  Comparison: Chest x-ray 12/26/2011.  Findings: Lung volumes are low.  No focal airspace consolidation. No definite pleural effusions.  Pulmonary vascular congestion (accentuated by low lung volumes), without frank pulmonary edema. Heart size and mediastinal contours are within normal limits. Atherosclerosis of the thoracic aorta.  IMPRESSION: 1.  Low lung volumes with mild pulmonary venous congestion, without frank pulmonary edema. 2.  Atherosclerosis.  Original Report Authenticated By: Florencia Reasons, M.D.    Cardiac Studies:  ECG:   NSR no acute ST elevation.  LVH   Telemetry:  NSR no VT  Echo: ordered today  Medications:     . aspirin  324 mg Oral NOW  . aspirin EC  81 mg Oral Daily  . atorvastatin  20 mg Oral q1800  . folic acid  1 mg Oral Daily  . LORazepam  0-4 mg Intravenous Q6H   Followed by  . LORazepam  0-4 mg Intravenous Q12H  . mulitivitamin with minerals  1 tablet Oral Daily  . nitroGLYCERIN  2-200 mcg/min Intravenous Once  . pneumococcal 23 valent vaccine  0.5 mL Intramuscular Tomorrow-1000  . thiamine  100 mg Oral Daily  . DISCONTD: aspirin  324 mg Oral Once  . DISCONTD: thiamine  100 mg Intravenous Daily       . sodium chloride 10 mL/hr (03/03/12 1500)  . heparin 1,700 Units/hr (03/04/12 0829)  . nitroGLYCERIN 20 mcg/min (03/03/12 2150)  . DISCONTD: sodium chloride Stopped  (03/03/12 1215)  . DISCONTD: sodium chloride 75 mL/hr (03/03/12 1104)    Assessment/Plan:  MI:  Enzymes elevated disproportionate to ECG changes.  Apparantly cath not done due to consent issues Tentatively plan cath Monday.  Echo today if possible Plt:  Chronic low platlet count no bleeding heparin started yesterday Cho:  Continue statin  Charlton Haws 03/04/2012, 9:43 AM

## 2012-03-04 NOTE — Progress Notes (Signed)
ANTICOAGULATION CONSULT NOTE - Follow Up Consult  Pharmacy Consult for Heparin Indication: ACS  Allergies  Allergen Reactions  . Penicillins Swelling and Rash    Patient states only has reaction to liquid penicillins    Patient Measurements: Height: 6' 2.5" (189.2 cm) Weight: 232 lb 5.8 oz (105.4 kg) IBW/kg (Calculated) : 83.35  Heparin Dosing Weight: 104kg  Vital Signs: Temp: 98.3 F (36.8 C) (03/16 0400) Temp src: Oral (03/16 0400) BP: 126/72 mmHg (03/16 0400) Pulse Rate: 64  (03/16 0400)  Labs:  Basename 03/04/12 0500 03/03/12 2357 03/03/12 2112 03/03/12 1819 03/03/12 1301 03/03/12 0920 03/03/12 0755  HGB 12.1* -- -- -- -- -- 12.8*  HCT 33.3* -- -- -- -- -- 34.7*  PLT 53* -- -- -- -- -- 60*  APTT -- -- -- -- -- 39* --  LABPROT -- -- -- -- -- 18.2* --  INR -- -- -- -- -- 1.48 --  HEPARINUNFRC 0.31 -- 0.23* -- -- -- --  CREATININE 1.09 -- -- -- -- -- 0.99  CKTOTAL -- 1630* -- 1811* 1650* -- --  CKMB -- 132.1* -- 160.7* 138.7* -- --  TROPONINI -- >25.00* -- >25.00* 20.71* -- --   Estimated Creatinine Clearance: 95.2 ml/min (by C-G formula based on Cr of 1.09).   Assessment: 50 yoM with CP and positive cardiac enzymes on heparin IV.  INR mildly elevated @ 1.48 in pt with h/o alcohol abuse.  Pt also has hx thrombocytopenia - plts 60-->53 today.  Hgb low @ 12.1   No bleeding noted.   Heparin level therapeutic @ 0.31.  Will empirically increase rate slightly and recheck 6 hour HL.    Goal of Therapy:  Heparin level 0.3-0.7 units/ml   Plan:  Increase heparin rate to 1700 units/hr.  F/u CBC, 6 hour HL.    Nichoals Heyde E 03/04/2012,7:39 AM

## 2012-03-04 NOTE — Progress Notes (Addendum)
ANTICOAGULATION CONSULT NOTE - Follow Up Consult  Pharmacy Consult for Heparin Indication: ACS  Allergies  Allergen Reactions  . Penicillins Swelling and Rash    Patient states only has reaction to liquid penicillins    Patient Measurements: Height: 6' 2.5" (189.2 cm) Weight: 232 lb 5.8 oz (105.4 kg) IBW/kg (Calculated) : 83.35  Heparin Dosing Weight: 104kg  Vital Signs: Temp: 98.4 F (36.9 C) (03/16 1200) Temp src: Oral (03/16 1200) BP: 135/71 mmHg (03/16 0741) Pulse Rate: 78  (03/16 0741)  Labs:  Basename 03/04/12 1345 03/04/12 0500 03/03/12 2357 03/03/12 2112 03/03/12 1819 03/03/12 1301 03/03/12 0920 03/03/12 0755  HGB -- 12.1* -- -- -- -- -- 12.8*  HCT -- 33.3* -- -- -- -- -- 34.7*  PLT -- 53* -- -- -- -- -- 60*  APTT -- -- -- -- -- -- 39* --  LABPROT -- -- -- -- -- -- 18.2* --  INR -- -- -- -- -- -- 1.48 --  HEPARINUNFRC 0.37 0.31 -- 0.23* -- -- -- --  CREATININE -- 1.09 -- -- -- -- -- 0.99  CKTOTAL -- -- 1630* -- 1811* 1650* -- --  CKMB -- -- 132.1* -- 160.7* 138.7* -- --  TROPONINI -- -- >25.00* -- >25.00* 20.71* -- --   Estimated Creatinine Clearance: 95.2 ml/min (by C-G formula based on Cr of 1.09).   Assessment: 62 yoM with CP and positive cardiac enzymes on heparin IV.  INR mildly elevated @ 1.48 in pt with h/o alcohol abuse.  Pt also has hx thrombocytopenia - plts 60-->53 today.  Hgb low @ 12.1   No bleeding noted.   Confirmation Heparin level therapeutic @ 0.37.   Tentative cath planned 03/18.    Goal of Therapy:  Heparin level 0.3-0.7 units/ml   Plan:  Continue heparin at current rate.  F/u daily heparin levels, CBC.      Zhaire Locker E 03/04/2012,3:25 PM

## 2012-03-05 DIAGNOSIS — I059 Rheumatic mitral valve disease, unspecified: Secondary | ICD-10-CM

## 2012-03-05 LAB — CBC
Platelets: 52 10*3/uL — ABNORMAL LOW (ref 150–400)
RBC: 3.51 MIL/uL — ABNORMAL LOW (ref 4.22–5.81)
RDW: 13.4 % (ref 11.5–15.5)
WBC: 6.1 10*3/uL (ref 4.0–10.5)

## 2012-03-05 LAB — HEPARIN LEVEL (UNFRACTIONATED): Heparin Unfractionated: 0.6 IU/mL (ref 0.30–0.70)

## 2012-03-05 MED ORDER — SODIUM CHLORIDE 0.9 % IJ SOLN: 3.0000 mL | INTRAMUSCULAR | Status: DC | PRN

## 2012-03-05 MED ORDER — ASPIRIN 81 MG PO CHEW
324.0000 mg | CHEWABLE_TABLET | ORAL | Status: AC
  Administered 2012-03-06: 324 mg via ORAL
  Filled 2012-03-05: qty 4

## 2012-03-05 MED ORDER — SODIUM CHLORIDE 0.9 % IJ SOLN: 3.0000 mL | Freq: Two times a day (BID) | INTRAMUSCULAR | Status: DC

## 2012-03-05 MED ORDER — SODIUM CHLORIDE 0.9 % IV SOLN: 250.0000 mL | INTRAVENOUS | Status: DC | PRN

## 2012-03-05 NOTE — Progress Notes (Signed)
  Echocardiogram 2D Echocardiogram has been performed.  Mercy Moore 03/05/2012, 9:40 AM

## 2012-03-05 NOTE — Progress Notes (Signed)
ANTICOAGULATION CONSULT NOTE - Follow Up Consult  Pharmacy Consult for Heparin Indication: ACS  Allergies  Allergen Reactions  . Penicillins Swelling and Rash    Patient states only has reaction to liquid penicillins    Patient Measurements: Height: 6' 2.5" (189.2 cm) Weight: 232 lb 5.8 oz (105.4 kg) IBW/kg (Calculated) : 83.35  Heparin Dosing Weight: 104kg  Vital Signs: Temp: 99.3 F (37.4 C) (03/17 0402) Temp src: Oral (03/17 0402) BP: 132/81 mmHg (03/17 0400) Pulse Rate: 88  (03/17 0402)  Labs:  Basename 03/05/12 0615 03/04/12 1345 03/04/12 0500 03/03/12 2357 03/03/12 1819 03/03/12 1301 03/03/12 0920 03/03/12 0755  HGB 12.6* -- 12.1* -- -- -- -- --  HCT 34.8* -- 33.3* -- -- -- -- 34.7*  PLT 52* -- 53* -- -- -- -- 60*  APTT -- -- -- -- -- -- 39* --  LABPROT -- -- -- -- -- -- 18.2* --  INR -- -- -- -- -- -- 1.48 --  HEPARINUNFRC 0.60 0.37 0.31 -- -- -- -- --  CREATININE -- -- 1.09 -- -- -- -- 0.99  CKTOTAL -- -- -- 1630* 1811* 1650* -- --  CKMB -- -- -- 132.1* 160.7* 138.7* -- --  TROPONINI -- -- -- >25.00* >25.00* 20.71* -- --   Estimated Creatinine Clearance: 95.2 ml/min (by C-G formula based on Cr of 1.09).   Assessment: 41 yoM with CP and positive cardiac enzymes on heparin IV.  INR mildly elevated @ 1.48 in pt with h/o alcohol abuse.  Pt also has hx thrombocytopenia - plts 60-->53-->52.  Hgb low, stable.   No bleeding noted.   Confirmation Heparin level therapeutic @ 0.60.   Tentative cath planned 03/18.    Goal of Therapy:  Heparin level 0.3-0.7 units/ml   Plan:  Continue heparin at current rate.   F/u daily heparin levels, CBC, s/sxs bleeding.       Jenny Omdahl E 03/05/2012,8:04 AM

## 2012-03-06 ENCOUNTER — Encounter (HOSPITAL_COMMUNITY)
Admission: EM | Disposition: A | Discharge status: Home / Self Care | Payer: Self-pay | Source: Home / Self Care | Attending: Cardiology

## 2012-03-06 ENCOUNTER — Other Ambulatory Visit: Payer: Self-pay

## 2012-03-06 DIAGNOSIS — D696 Thrombocytopenia, unspecified: Secondary | ICD-10-CM

## 2012-03-06 DIAGNOSIS — K766 Portal hypertension: Secondary | ICD-10-CM

## 2012-03-06 DIAGNOSIS — K7030 Alcoholic cirrhosis of liver without ascites: Secondary | ICD-10-CM

## 2012-03-06 DIAGNOSIS — I219 Acute myocardial infarction, unspecified: Secondary | ICD-10-CM

## 2012-03-06 DIAGNOSIS — I2510 Atherosclerotic heart disease of native coronary artery without angina pectoris: Secondary | ICD-10-CM

## 2012-03-06 DIAGNOSIS — I214 Non-ST elevation (NSTEMI) myocardial infarction: Secondary | ICD-10-CM

## 2012-03-06 HISTORY — PX: LEFT HEART CATHETERIZATION WITH CORONARY ANGIOGRAM: SHX5451

## 2012-03-06 LAB — CBC
HCT: 30.7 % — ABNORMAL LOW (ref 39.0–52.0)
Hemoglobin: 11.2 g/dL — ABNORMAL LOW (ref 13.0–17.0)
MCH: 37 pg — ABNORMAL HIGH (ref 26.0–34.0)
MCHC: 36.5 g/dL — ABNORMAL HIGH (ref 30.0–36.0)

## 2012-03-06 LAB — SAVE SMEAR

## 2012-03-06 LAB — HEPARIN LEVEL (UNFRACTIONATED): Heparin Unfractionated: 0.33 [IU]/mL (ref 0.30–0.70)

## 2012-03-06 SURGERY — LEFT HEART CATHETERIZATION WITH CORONARY ANGIOGRAM
Anesthesia: Moderate Sedation

## 2012-03-06 MED ORDER — ONDANSETRON HCL 4 MG/2ML IJ SOLN: 4.0000 mg | Freq: Four times a day (QID) | INTRAMUSCULAR | Status: DC | PRN

## 2012-03-06 MED ORDER — SODIUM CHLORIDE 0.9 % IV SOLN
INTRAVENOUS | Status: DC
  Administered 2012-03-06: 09:00:00 via INTRAVENOUS

## 2012-03-06 MED ORDER — BIVALIRUDIN 250 MG IV SOLR
INTRAVENOUS | Status: AC
  Filled 2012-03-06: qty 250

## 2012-03-06 MED ORDER — FENTANYL CITRATE 0.05 MG/ML IJ SOLN
INTRAMUSCULAR | Status: AC
  Filled 2012-03-06: qty 2

## 2012-03-06 MED ORDER — MIDAZOLAM HCL 2 MG/2ML IJ SOLN
INTRAMUSCULAR | Status: AC
  Filled 2012-03-06: qty 2

## 2012-03-06 MED ORDER — SODIUM CHLORIDE 0.9 % IV SOLN: 250.0000 mL | INTRAVENOUS | Status: DC

## 2012-03-06 MED ORDER — HEPARIN SODIUM (PORCINE) 1000 UNIT/ML IJ SOLN
INTRAMUSCULAR | Status: AC
  Filled 2012-03-06: qty 1

## 2012-03-06 MED ORDER — CLOPIDOGREL BISULFATE 75 MG PO TABS
75.0000 mg | ORAL_TABLET | Freq: Every day | ORAL | Status: DC
  Administered 2012-03-07: 75 mg via ORAL
  Filled 2012-03-06: qty 1

## 2012-03-06 MED ORDER — HEPARIN (PORCINE) IN NACL 2-0.9 UNIT/ML-% IJ SOLN
INTRAMUSCULAR | Status: AC
  Filled 2012-03-06: qty 2000

## 2012-03-06 MED ORDER — NITROGLYCERIN 0.2 MG/ML ON CALL CATH LAB
INTRAVENOUS | Status: AC
  Filled 2012-03-06: qty 1

## 2012-03-06 MED ORDER — VERAPAMIL HCL 2.5 MG/ML IV SOLN
INTRAVENOUS | Status: AC
  Filled 2012-03-06: qty 2

## 2012-03-06 MED ORDER — SODIUM CHLORIDE 0.9 % IJ SOLN: 3.0000 mL | INTRAMUSCULAR | Status: DC | PRN

## 2012-03-06 MED ORDER — MIDAZOLAM HCL 2 MG/2ML IJ SOLN
INTRAMUSCULAR | Status: AC
Start: 2012-03-06 — End: 2012-03-06
  Filled 2012-03-06: qty 2

## 2012-03-06 MED ORDER — LIDOCAINE HCL (PF) 1 % IJ SOLN
INTRAMUSCULAR | Status: AC
  Filled 2012-03-06: qty 30

## 2012-03-06 MED ORDER — SODIUM CHLORIDE 0.9 % IV SOLN: 1.0000 mL/kg/h | INTRAVENOUS | Status: AC

## 2012-03-06 MED ORDER — SODIUM CHLORIDE 0.9 % IJ SOLN: 3.0000 mL | Freq: Two times a day (BID) | INTRAMUSCULAR | Status: DC

## 2012-03-06 MED ORDER — CLOPIDOGREL BISULFATE 300 MG PO TABS
ORAL_TABLET | ORAL | Status: AC
  Administered 2012-03-07: 75 mg via ORAL
  Filled 2012-03-06: qty 2

## 2012-03-06 MED ORDER — ACETAMINOPHEN 325 MG PO TABS: 650.0000 mg | ORAL_TABLET | ORAL | Status: DC | PRN

## 2012-03-06 MED ORDER — LORAZEPAM 0.5 MG PO TABS
0.5000 mg | ORAL_TABLET | Freq: Three times a day (TID) | ORAL | Status: DC | PRN
  Administered 2012-03-06: 0.5 mg via ORAL

## 2012-03-06 NOTE — H&P (View-Only) (Signed)
  Patient Name: Justin Long      SUBJECTIVE: no furtherchest pain  Past Medical History  Diagnosis Date  . Alcohol abuse   . Back pain   . Depression with anxiety   . Carpal tunnel syndrome 03/03/2012  . Bilateral claudication of lower limb 03/03/2012  . Myocardial infarction acute question septal 03/03/2012    ST elevation V1 and V2     PHYSICAL EXAM Filed Vitals:   03/05/12 2327 03/06/12 0000 03/06/12 0345 03/06/12 0400  BP: 104/63 104/63 100/61 100/61  Pulse:  77  77  Temp: 98.3 F (36.8 C)  99.3 F (37.4 C)   TempSrc: Oral  Oral   Resp:  25    Height:      Weight:      SpO2: 94%  94%     General appearance: alert, cooperative and no distress Neck: no JVD Lungs: clear to auscultation bilaterally Heart: regular rate and rhythm, S1, S2 normal, no murmur, click, rub or gallop Abdomen: soft, non-tender; bowel sounds normal; no masses,  no organomegaly Skin: Skin color, texture, turgor normal. No rashes or lesions Neurologic: Alert and oriented X 3, normal strength and tone. Normal symmetric reflexes. Normal coordination and gait  TELEMETRY: Reviewed telemetry pt in NSR :    Intake/Output Summary (Last 24 hours) at 03/06/12 0757 Last data filed at 03/06/12 0600  Gross per 24 hour  Intake   1733 ml  Output   2525 ml  Net   -792 ml    LABS: Basic Metabolic Panel:  Lab 03/04/12 0500 03/03/12 0755  NA 136 138  K 3.6 3.6  CL 106 106  CO2 24 24  GLUCOSE 122* 106*  BUN 6 6  CREATININE 1.09 0.99  CALCIUM 8.2* 8.7  MG -- --  PHOS -- --   Cardiac Enzymes:  Basename 03/03/12 2357 03/03/12 1819 03/03/12 1301  CKTOTAL 1630* 1811* 1650*  CKMB 132.1* 160.7* 138.7*  CKMBINDEX -- -- --  TROPONINI >25.00* >25.00* 20.71*   CBC:  Lab 03/06/12 0559 03/05/12 0615 03/04/12 0500 03/03/12 0755  WBC 4.4 6.1 5.6 4.9  NEUTROABS -- -- -- 3.4  HGB 11.2* 12.6* 12.1* 12.8*  HCT 30.7* 34.8* 33.3* 34.7*  MCV 101.3* 99.1 100.9* 98.9  PLT 43* 52* 53* 60*    PROTIME:  Basename 03/03/12 0920  LABPROT 18.2*  INR 1.48   Liver Function Tests:  Basename 03/04/12 0500  AST 195*  ALT 47  ALKPHOS 139*  BILITOT 1.8*  PROT 6.2  ALBUMIN 2.0*    Basename 03/03/12 1301  HGBA1C 4.7   Fasting Lipid Panel:  Basename 03/04/12 0500  CHOL 178  HDL 42  LDLCALC 121*  TRIG 75  CHOLHDL 4.2  LDLDIRECT --   Thyroid Function Tests:  Basename 03/03/12 1301  TSH 2.971  T4TOTAL --  T3FREE --  THYROIDAB --   Anemia Panel: No results found for this basename: VITAMINB12,FOLATE,FERRITIN,TIBC,IRON,RETICCTPCT in the last 72 hours      ASSESSMENT AND PLAN:  Patient Active Hospital Problem List: Myocardial infarction acute question septal --OOH  With pk Tn >25   Bilateral claudication of lower limb (03/03/2012)   Polysubstance abuse (03/03/2012)  ** Thrombocytopenia-chronic (03/03/2012)  Pt on heparin;  Large OOH MI  Thrombocytopenia will present problems with PCI options   Will alert DR [facility]   Also will schedule ABIs        Signed,   MD  03/06/2012   

## 2012-03-06 NOTE — Interval H&P Note (Signed)
History and Physical Interval Note:  03/06/2012 11:46 AM  Justin Long  has presented today for surgery, with the diagnosis of SEMI  The various methods of treatment have been discussed with the patient and family. After consideration of risks, benefits and other options for treatment, the patient has consented to  Procedure(s) (LRB): LEFT HEART CATHETERIZATION WITH CORONARY ANGIOGRAM (N/A) as a surgical intervention .  The patients' history has been reviewed, patient examined, no change in status, stable for surgery.  I have reviewed the patients' chart and labs.  Questions were answered to the patient's satisfaction.  Pt noted to have thrombocytopenia, worse than baseline but chronic problem. Considering cocaine use, thrombocytopenia, and noncompliance, will have high threshold for intervention.   Tonny Bollman 03/06/2012 11:46 AM

## 2012-03-06 NOTE — Progress Notes (Signed)
  Patient Name: Justin Long      SUBJECTIVE: no furtherchest pain  Past Medical History  Diagnosis Date  . Alcohol abuse   . Back pain   . Depression with anxiety   . Carpal tunnel syndrome 03/03/2012  . Bilateral claudication of lower limb 03/03/2012  . Myocardial infarction acute question septal 03/03/2012    ST elevation V1 and V2     PHYSICAL EXAM Filed Vitals:   03/05/12 2327 03/06/12 0000 03/06/12 0345 03/06/12 0400  BP: 104/63 104/63 100/61 100/61  Pulse:  77  77  Temp: 98.3 F (36.8 C)  99.3 F (37.4 C)   TempSrc: Oral  Oral   Resp:  25    Height:      Weight:      SpO2: 94%  94%     General appearance: alert, cooperative and no distress Neck: no JVD Lungs: clear to auscultation bilaterally Heart: regular rate and rhythm, S1, S2 normal, no murmur, click, rub or gallop Abdomen: soft, non-tender; bowel sounds normal; no masses,  no organomegaly Skin: Skin color, texture, turgor normal. No rashes or lesions Neurologic: Alert and oriented X 3, normal strength and tone. Normal symmetric reflexes. Normal coordination and gait  TELEMETRY: Reviewed telemetry pt in NSR :    Intake/Output Summary (Last 24 hours) at 03/06/12 0757 Last data filed at 03/06/12 0600  Gross per 24 hour  Intake   1733 ml  Output   2525 ml  Net   -792 ml    LABS: Basic Metabolic Panel:  Lab 03/04/12 7829 03/03/12 0755  NA 136 138  K 3.6 3.6  CL 106 106  CO2 24 24  GLUCOSE 122* 106*  BUN 6 6  CREATININE 1.09 0.99  CALCIUM 8.2* 8.7  MG -- --  PHOS -- --   Cardiac Enzymes:  Basename 03/03/12 2357 03/03/12 1819 03/03/12 1301  CKTOTAL 1630* 1811* 1650*  CKMB 132.1* 160.7* 138.7*  CKMBINDEX -- -- --  TROPONINI >25.00* >25.00* 20.71*   CBC:  Lab 03/06/12 0559 03/05/12 0615 03/04/12 0500 03/03/12 0755  WBC 4.4 6.1 5.6 4.9  NEUTROABS -- -- -- 3.4  HGB 11.2* 12.6* 12.1* 12.8*  HCT 30.7* 34.8* 33.3* 34.7*  MCV 101.3* 99.1 100.9* 98.9  PLT 43* 52* 53* 60*    PROTIME:  Basename 03/03/12 0920  LABPROT 18.2*  INR 1.48   Liver Function Tests:  Basename 03/04/12 0500  AST 195*  ALT 47  ALKPHOS 139*  BILITOT 1.8*  PROT 6.2  ALBUMIN 2.0*    Basename 03/03/12 1301  HGBA1C 4.7   Fasting Lipid Panel:  Basename 03/04/12 0500  CHOL 178  HDL 42  LDLCALC 121*  TRIG 75  CHOLHDL 4.2  LDLDIRECT --   Thyroid Function Tests:  Basename 03/03/12 1301  TSH 2.971  T4TOTAL --  T3FREE --  THYROIDAB --   Anemia Panel: No results found for this basename: VITAMINB12,FOLATE,FERRITIN,TIBC,IRON,RETICCTPCT in the last 72 hours      ASSESSMENT AND PLAN:  Patient Active Hospital Problem List: Myocardial infarction acute question septal --OOH  With pk Tn >25   Bilateral claudication of lower limb (03/03/2012)   Polysubstance abuse (03/03/2012)  ** Thrombocytopenia-chronic (03/03/2012)  Pt on heparin;  Large OOH MI  Thrombocytopenia will present problems with PCI options   Will alert DR [facility]   Also will schedule ABIs        Signed, Sherryl Manges MD  03/06/2012

## 2012-03-06 NOTE — CV Procedure (Signed)
   Cardiac Catheterization Procedure Note  Name: Justin Long MRN: [id number] DOB: 07-29-1951  Procedure: Left Heart Cath, Selective Coronary Angiography, LV angiography, PTCA and stenting of the left circumflex  Indication: NSTEMI  Procedural Details:  The right wrist was prepped, draped, and anesthetized with 1% lidocaine. Using the modified Seldinger technique, a 5 French sheath was introduced into the right radial artery. 3 mg of verapamil was administered through the sheath, weight-based unfractionated heparin was administered intravenously. Standard Judkins catheters were used for selective coronary angiography and left ventriculography. Catheter exchanges were performed over an exchange length guidewire.  PROCEDURAL FINDINGS Hemodynamics: AO 107/66 LV 112/22   Coronary angiography: Coronary dominance: right  Left mainstem: Patent with no obstructive disease  Left anterior descending (LAD): The LAD is patent to the LV apex. There is no significant obstructive disease. At the junction of the mid and distal vessel there is an area of 30-40% stenosis involved at a hinge point in the vessel. There is a large first diagonal with nonobstructive disease.  Left circumflex (LCx): The left circumflex is patent until the mid vessel where there is a severe, ulcerated plaque with 95-99% stenosis present. The vessel supplies an obtuse marginal branch beyond this area. There is TIMI-3 flow in the vessel.  Right coronary artery (RCA): Dominant vessel. Widely patent with a PDA and a posterolateral branch. There is minor nonobstructive disease in the mid vessel  Left ventriculography: Left ventricular systolic function is normal, LVEF is estimated at 55-65%, there is no significant mitral regurgitation   PCI Note:  Following the diagnostic procedure, the decision was made to proceed with PCI. The radial sheath was upsized to a 6 [place]. Weight-based bivalirudin was given for anticoagulation. The  patient was given Plavix 600 mg on the table. I carefully weighed the risks/benefit of proceeding with intervention in this gentleman with significant thrombocytopenia. However, the left circumflex had critical stenosis and I thought there was a potential for abrupt vessel closure. I elected to proceed with PCI planning on either balloon angioplasty or bare metal stenting if necessary. The patient has no history of bleeding problems. Once a therapeutic ACT was achieved, a 6 [place] XB LAD 3.5 cm guide catheter was inserted.  A cougar coronary guidewire was used to cross the lesion.  The lesion was predilated with a 3.0 x 15 mm balloon.  The lesion was then stented with a 3.0 x 15 mm vision bare metal stent.  The stent was postdilated with a 3.25 x 12 mm noncompliant balloon.  Following PCI, there was 0% residual stenosis and TIMI-3 flow. Final angiography confirmed an excellent result. The patient tolerated the procedure well. There were no immediate procedural complications. A TR band was used for radial hemostasis. The patient was transferred to the post catheterization recovery area for further monitoring.  PCI Data: Vessel - left circumflex/Segment - mid Percent Stenosis (pre)  99 TIMI-flow 3 Stent vision bare metal Percent Stenosis (post) 0 TIMI-flow (post) 3  Final Conclusions:   1. 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 Minor nonobstructive stenosis in the LAD and right coronary artery 3. Normal left ventricular systolic function   Recommendations:  Aspirin 81 mg and Plavix 75 mg limited to 30 days. Would continue aspirin 81 mg long term if tolerated. I will need to review whether he has had prior hematologic workup for his thrombocytopenia.  Tonny Bollman 03/06/2012, 1:01 PM
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
## 2012-03-06 NOTE — Progress Notes (Signed)
UR Completed. Simmons, Edd Reppert F [phone]  

## 2012-03-07 ENCOUNTER — Inpatient Hospital Stay (HOSPITAL_COMMUNITY): Payer: Medicare Other

## 2012-03-07 ENCOUNTER — Other Ambulatory Visit: Payer: Self-pay

## 2012-03-07 LAB — BASIC METABOLIC PANEL
CO2: 24 mEq/L (ref 19–32)
Calcium: 7.7 mg/dL — ABNORMAL LOW (ref 8.4–10.5)
GFR calc Af Amer: >90 mL/min (ref >90)
GFR calc non Af Amer: 88 mL/min — ABNORMAL LOW (ref >90)
Sodium: 140 mEq/L (ref 135–145)

## 2012-03-07 LAB — CBC
MCH: 36.3 pg — ABNORMAL HIGH (ref 26.0–34.0)
MCHC: 36.3 g/dL — ABNORMAL HIGH (ref 30.0–36.0)
Platelets: 48 10*3/uL — ABNORMAL LOW (ref 150–400)
RBC: 3.06 MIL/uL — ABNORMAL LOW (ref 4.22–5.81)
RDW: 13.6 % (ref 11.5–15.5)

## 2012-03-07 LAB — HIV ANTIBODY (ROUTINE TESTING W REFLEX): HIV: NONREACTIVE

## 2012-03-07 MED ORDER — NICOTINE 14 MG/24HR TD PT24: 1.0000 | MEDICATED_PATCH | TRANSDERMAL | Status: AC

## 2012-03-07 MED ORDER — ADULT MULTIVITAMIN W/MINERALS CH: 1.0000 | ORAL_TABLET | Freq: Every day | ORAL | Status: AC

## 2012-03-07 MED ORDER — NICOTINE 21 MG/24HR TD PT24: 1.0000 | MEDICATED_PATCH | TRANSDERMAL | Status: AC

## 2012-03-07 MED ORDER — ATORVASTATIN CALCIUM 20 MG PO TABS: 20.0000 mg | ORAL_TABLET | Freq: Every day | ORAL | Status: AC

## 2012-03-07 MED ORDER — ASPIRIN 81 MG PO TABS: 81.0000 mg | ORAL_TABLET | Freq: Every day | ORAL | Status: AC

## 2012-03-07 MED ORDER — METOPROLOL TARTRATE 25 MG PO TABS: 25.0000 mg | ORAL_TABLET | Freq: Two times a day (BID) | ORAL | Status: AC

## 2012-03-07 MED ORDER — CLOPIDOGREL BISULFATE 75 MG PO TABS: 75.0000 mg | ORAL_TABLET | Freq: Every day | ORAL | Status: AC

## 2012-03-07 MED ORDER — METOPROLOL TARTRATE 25 MG PO TABS
25.0000 mg | ORAL_TABLET | Freq: Two times a day (BID) | ORAL | Status: DC
  Administered 2012-03-07: 25 mg via ORAL

## 2012-03-07 MED ORDER — NICOTINE 7 MG/24HR TD PT24: 1.0000 | MEDICATED_PATCH | TRANSDERMAL | Status: AC

## 2012-03-07 MED ORDER — NITROGLYCERIN 0.4 MG SL SUBL: 0.4000 mg | SUBLINGUAL_TABLET | SUBLINGUAL | Status: AC | PRN

## 2012-03-07 MED FILL — Dextrose Inj 5%: INTRAVENOUS | Qty: 50 | Status: AC

## 2012-03-07 NOTE — Progress Notes (Signed)
  Patient Name: Justin Long      SUBJECTIVE: without chest pain  Some pain in shoulder  Past Medical History  Diagnosis Date  . Alcohol abuse   . Back pain   . Depression with anxiety   . Carpal tunnel syndrome 03/03/2012  . Bilateral claudication of lower limb 03/03/2012  . Myocardial infarction acute question septal 03/03/2012    ST elevation V1 and V2     PHYSICAL EXAM Filed Vitals:   03/06/12 2032 03/07/12 0007 03/07/12 0300 03/07/12 0610  BP: 148/56 111/65  115/78  Pulse: 70 91 69 91  Temp: 98.7 F (37.1 C) 98.5 F (36.9 C)  98.2 F (36.8 C)  TempSrc: Oral Oral  Oral  Resp: 16 13  16   Height:      Weight:  239 lb 10.2 oz (108.7 kg)    SpO2: 100% 98%  98%    Well developed and nourished in no acute distress HENT normal Neck supple with JVP-flat Clear Regular rate and rhythm, no murmurs or gallops Abd-soft with active BS No Clubbing cyanosis edema Skin-warm and dry A & Oriented  Grossly normal sensory and motor function Unable to lift R amr    TELEMETRY: Reviewed telemetry pt in NSR:    Intake/Output Summary (Last 24 hours) at 03/07/12 0904 Last data filed at 03/07/12 0611  Gross per 24 hour  Intake    337 ml  Output   1600 ml  Net  -1263 ml    LABS: Basic Metabolic Panel:  Lab 03/07/12 1610 03/04/12 0500 03/03/12 0755  NA 140 136 138  K 3.7 3.6 3.6  CL 111 106 106  CO2 24 24 24   GLUCOSE 123* 122* 106*  BUN 8 6 6   CREATININE 0.98 1.09 0.99  CALCIUM 7.7* 8.2* --  MG -- -- --  PHOS -- -- --   Cardiac Enzymes: No results found for this basename: CKTOTAL:3,CKMB:3,CKMBINDEX:3,TROPONINI:3 in the last 72 hours CBC:  Lab 03/07/12 0405 03/06/12 0559 03/05/12 0615 03/04/12 0500 03/03/12 0755  WBC 3.7* 4.4 6.1 5.6 4.9  NEUTROABS -- -- -- -- 3.4  HGB 11.1* 11.2* 12.6* 12.1* 12.8*  HCT 30.6* 30.7* 34.8* 33.3* 34.7*  MCV 100.0 101.3* 99.1 100.9* 98.9  PLT 48* 43* 52* 53* 60*     ASSESSMENT AND PLAN:  Patient Active Hospital Problem  List: Myocardial infarction acute question septal (03/03/2012) Bilateral claudication of lower limb (03/03/2012) *Polysubstance abuse (03/03/2012) Thrombocytopenia-chronic (03/03/2012)--appreicate HEME input  Have reviewed importance of antiplatlet therapy Add betablocker 25 metoprolol bid Shoulder xray and ortho consult  Anticipate discharge today or tomorrow *    Signed, Sherryl Manges MD  03/07/2012

## 2012-03-07 NOTE — Progress Notes (Signed)
CARDIAC REHAB PHASE I   PRE:  Rate/Rhythm: 77SR  BP:  Supine: 120/74  Sitting:   Standing:    SaO2:   MODE:  Ambulation: 250 ft   POST:  Rate/Rhythem: 88SR  BP:  Supine:   Sitting: 113/67  Standing:    SaO2:  4098-1191 Pt walked with difficulty. Slow pace,holding to siderail. Stated limitation in activity due to leg pain. Denied chest pain. Education completed. Encouraged pt to read ed materials as I went over a lot of information. Pt asked to see case manager about VA benefits. Pt gave permission to refer to [facility] Phase 2. Encouraged smoking cessation.  Duanne Limerick

## 2012-03-07 NOTE — Consult Note (Signed)
Pt smokes at least 1 ppd that he can think of. Discussed risk factors in detail with the pt. He is willing to try the patch suggested to him. Recommended 21 mg patch x 6 weeks, 14 mg patch x 2 weeks and the 7 mg patch x 2 weeks. Discussed patch use iinstructions and how to taper. Referred to 1-800 quit now for f/u and support. Discussed oral fixation substitutes, second hand smoke and in home smoking policy. Reviewed and gave pt Written education/contact information.

## 2012-03-07 NOTE — Progress Notes (Signed)
   CARE MANAGEMENT NOTE 03/07/2012  Patient:  Justin Long, Justin Long   Account Number:  [id number]  Date Initiated:  03/07/2012  Documentation initiated by:  GRAVES-BIGELOW,Azzam Mehra  Subjective/Objective Assessment:   Pt admitted with cp-N stemi.S/p LHC. Pt stated that he lost his EBT card for food stamps and he wanted the card to be stopped and reissued. CM received # to call EBT @ [phone] and they will issue card in 5-7 business days.     Action/Plan:   No further needs assessed by CM at this time. Plan for ultrasound of abdomen and possible d/c today.   Anticipated DC Date:  03/08/2012   Anticipated DC Plan:  HOME/SELF CARE      DC Planning Services  CM consult      Choice offered to / List presented to:             Status of service:  Completed, signed off Medicare Important Message given?   (If response is "NO", the following Medicare IM given date fields will be blank) Date Medicare IM given:   Date Additional Medicare IM given:    Discharge Disposition:  HOME/SELF CARE  Per UR Regulation:    If discussed at Long Length of Stay Meetings, dates discussed:    Comments:

## 2012-03-07 NOTE — Progress Notes (Signed)
Mr. Riner is stable. His platelet count is 48. I still believe that this is all reflective of cirrhosis. He is going for a ultrasound to look for splenomegaly. I'm sure that it has some degree of hypersplenism. He is being checked for hepatitis and HIV.  I do not see any problem him been on antiplatelet agents. I believe that if the bone marrow were done on him, he would have a lot  of platelets in his marrow.  We will continue to follow along as indicated. I do not see a need for any invasive test on Mr. Mcguiness. We will await his hepatitis and HIV.   His blood smear definitely is consistent with a consumption or peripheral destructive process. He has large platelets that are well granulated. As such, his risk of bleeding is low despite his thrombocytopenia. Fact, I believe that his platelets are very functional and that he would benefit from antiplatelet therapy to help with his cardiovascular issues.  We will stay strong in prayer for him.  James 1:5-6  Pete E.

## 2012-03-07 NOTE — Discharge Instructions (Signed)
NO Tobacco NO Alcohol NO Drug use  NO HEAVY LIFTING OR SEXUAL ACTIVITY X 7 DAYS. NO DRIVING X 2 DAYS. NO SOAKING BATHS, HOT TUBS, POOLS, ETC., X 5 DAYS.  Be sure and call Dr Nilsa Nutting office for an appointment with either Dr Charlann Boxer, Dr Rennis Chris or Dr Ranell Patrick in a week or so. [phone] Coronary Syndrome Acute coronary syndrome (ACS) is an urgent problem in which the blood and oxygen supply to the heart is critically deficient. ACS requires hospitalization because one or more coronary arteries may be blocked. ACS represents a range of conditions including:  Previous angina that is now unstable, lasts longer, happens at rest, or is more intense.   A heart attack, with heart muscle cell injury and death.  There are three vital coronary arteries that supply the heart muscle with blood and oxygen so that it can pump blood effectively. If blockages to these arteries develop, blood flow to the heart muscle is reduced. If the heart does not get enough blood, angina may occur as the first warning sign. SYMPTOMS   The most common signs of angina include:   Tightness or squeezing in the chest.   Feeling of heaviness on the chest.   Discomfort in the arms, neck, or jaw.   Shortness of breath and nausea.   Cold, wet skin.   Angina is usually brought on by physical effort or excitement which increase the oxygen needs of the heart. These states increase the blood flow needs of the heart beyond what can be delivered.  TREATMENT   Medicines to help discomfort may include nitroglycerin (nitro) in the form of tablets or a spray for rapid relief, or longer-acting forms such as cream, patches, or capsules. (Be aware that there are many side effects and possible interactions with other drugs).   Other medicines may be used to help the heart pump better.   Procedures to open blocked arteries including angioplasty or stent placement to keep the arteries open.   Open heart surgery may be needed when there  are many blockages or they are in critical locations that are best treated with surgery.  HOME CARE INSTRUCTIONS   Avoid smoking.   Take one baby or adult aspirin daily, if your caregiver advises. This helps reduce the risk of a heart attack.   It is very important that you follow the angina treatment prescribed by your caregiver. Make arrangements for proper follow-up care.   Eat a heart healthy diet with salt and fat restrictions as advised.   Regular exercise is good for you as long as it does not cause discomfort. Do not begin any new type of exercise until you check with your caregiver.   If you are overweight, you should lose weight.   Try to maintain normal blood lipid levels.   Keep your blood pressure under control as recommended by your caregiver.   You should tell your caregiver right away about any increase in the severity or frequency of your chest discomfort or angina attacks. When you have angina, you should stop what you are doing and sit down. This may bring relief in 3 to 5 minutes. If your caregiver has prescribed nitro, take it as directed.   If your caregiver has given you a follow-up appointment, it is very important to keep that appointment. Not keeping the appointment could result in a chronic or permanent injury, pain, and disability. If there is any problem keeping the appointment, you must call back to this facility for  assistance.  SEEK IMMEDIATE MEDICAL CARE IF:   You develop nausea, vomiting, or shortness of breath.   You feel faint, lightheaded, or pass out.   Your chest discomfort gets worse.   You are sweating or experience sudden profound fatigue.   You do not get relief of your chest pain after 3 doses of nitro.   Your discomfort lasts longer than 15 minutes.  MAKE SURE YOU:   Understand these instructions.   Will watch your condition.   Will get help right away if you are not doing well or get worse.  Document Released: 12/06/2005 Document  Revised: 11/25/2011 Document Reviewed: 07/09/2008 [facility] Patient Information 2012 [place], [place].

## 2012-03-07 NOTE — Progress Notes (Signed)
Provided pt with a bus pass.

## 2012-03-07 NOTE — Discharge Summary (Signed)
CARDIOLOGY DISCHARGE SUMMARY   Patient ID: Justin Long MRN: [id number] DOB/AGE: [birth year] 61 y.o.  Admit date: 03/03/2012 Discharge date: 03/07/2012  Primary Discharge Diagnosis:   . 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 Bilateral claudication of lower limb  . Polysubstance abuse  . Anxiety  . Carpal tunnel syndrome  . Thrombocytopenia-chronic    Consults: Dr Charlann Boxer, Dr Myna Hidalgo  Procedures: 2D echo, Shoulder Xray, CXR, Left Heart Cath, Selective Coronary Angiography, LV angiography, PTCA and stenting of the left circumflex   Hospital Course: Mr Dwan is a 61 year old male with no previous history of CAD. He came to the ER with chest pain and SOB. His symptoms improved with medical therapy but his ECG and cardiac enzymes were abnormal and he was admitted for further evaluation and treatment.  His cardiac enzymes elevated, indicating a NSTEMI. He remained pain-free on medical therapy. His platelets were low (not new) and a hematology consult was called.   Dr Myna Hidalgo saw Mr Vandeberg and the results follow:  I looked at the smear and it clearly shows large plt that are well granulated. No immature myeloid cells. No blasts. No nucleated RBC's. Some target cells. No schistocytes.   He does NOT have HIT by my criteria. He definitely has portal hypertension and cirrhosis. 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 He really is not at an increased risk of bleeding from my point of view. Therefore, I see no problem with him on ASA/Plavix or for that matter heparin/LMWH/Arixtra. I think his risk of bleeding from low plt is quite low. Even one of the IIbIIIa inhbitors could be tried if needed.   I would check him for Hepatitis and HIV as he is in a risk group for these diseases. I would repeat an abdominal U/S to  look at the liver and spleen.   His plt will always be low in my opinion as the "damage has been done."  Hepatitis/HIV/HIT panels are pending at the time of the dictation.  Once Dr Myna Hidalgo saw Mr Troublefield, he was taken to the cath lab.   The results are: Left mainstem: Patent with no obstructive disease  Left anterior descending (LAD): The LAD is patent to the LV apex. There is no significant obstructive disease. At the junction of the mid and distal vessel there is an area of 30-40% stenosis involved at a hinge point in the vessel. There is a large first diagonal with nonobstructive disease.  Left circumflex (LCx): The left circumflex is patent until the mid vessel where there is a severe, ulcerated plaque with 95-99% stenosis present. The vessel supplies an obtuse marginal branch beyond this area. There is TIMI-3 flow in the vessel.  Right coronary artery (RCA): Dominant vessel. Widely patent with a PDA and a posterolateral branch. There is minor nonobstructive disease in the mid vessel  Left ventriculography: Left ventricular systolic function is normal, LVEF is estimated at 55-65%, there is no significant mitral regurgitation.   The lesion was then stented with a 3.0 x 15 mm vision bare metal stent. The stent was postdilated with a 3.25 x 12 mm noncompliant balloon. Following PCI, there was 0% residual stenosis and TIMI-3 flow.  Mr Lowery was seen by cardiac rehab and smoking cessation. He was placed on CIWA protocol for ETOH  withdrawal, completing this prior to discharge. He was encouraged to follow up with cardiac rehab as an outpatient and continue to abstain from tobacco, ETOH and drugs.  His shoulder pain was significant and Dr Graciela Husbands ordered an Xray which showed DJD. Dr Charlann Boxer was consulted by phone and said the patient had DJD and possibly a rotator cuff tear (likely partial). He needs evaluation and possibly a steroid injection or other treatment but needs more recovery from his MI first. Dr  Charlann Boxer stated he, Dr Rennis Chris or Dr Ranell Patrick would see the patient in a week or so. A staff message has been sent to Dr Rennis Chris and the patient has been given contact information to their office.  On 03/07/2012, Mr Labrie was seen by Dr Graciela Husbands. He was ambulating without chest pain or SOB. He is considered stable for discharge, to follow up as an outpatient.    Labs:  Lab Results  Component Value Date   WBC 3.7* 03/07/2012   HGB 11.1* 03/07/2012   HCT 30.6* 03/07/2012   MCV 100.0 03/07/2012   PLT 48* 03/07/2012    Lab 03/07/12 0405 03/04/12 0500  NA 140 --  K 3.7 --  CL 111 --  CO2 24 --  BUN 8 --  CREATININE 0.98 --  CALCIUM 7.7* --  PROT -- 6.2  BILITOT -- 1.8*  ALKPHOS -- 139*  ALT -- 47  AST -- 195*  GLUCOSE 123* --   Lab Results  Component Value Date   CKTOTAL 1630* 03/03/2012   CKMB 132.1* 03/03/2012   TROPONINI >25.00* 03/03/2012    Lipid Panel     Component Value Date/Time   CHOL 178 03/04/2012 0500   TRIG 75 03/04/2012 0500   HDL 42 03/04/2012 0500   CHOLHDL 4.2 03/04/2012 0500   VLDL 15 03/04/2012 0500   LDLCALC 121* 03/04/2012 0500   HIV: Non-reactive  Hepatitis/HIT panels are pending at the time of the dictation.   Radiology:  Dg Chest 2 View 03/03/2012  *RADIOLOGY REPORT*  Clinical Data: Mid chest pain and shortness of breath.  CHEST - 2 VIEW  Comparison: Chest x-ray 12/26/2011.  Findings: Lung volumes are low.  No focal airspace consolidation. No definite pleural effusions.  Pulmonary vascular congestion (accentuated by low lung volumes), without frank pulmonary edema. Heart size and mediastinal contours are within normal limits. Atherosclerosis of the thoracic aorta.  IMPRESSION: 1.  Low lung volumes with mild pulmonary venous congestion, without frank pulmonary edema. 2.  Atherosclerosis.  Original Report Authenticated By: Florencia Reasons, M.D.   Dg Shoulder Right 03/07/2012  *RADIOLOGY REPORT*  Clinical Data: No injury.  Pain.  RIGHT SHOULDER - 2+ VIEW  Comparison:  07/27/2010.  Findings: Prominent acromioclavicular joint degenerative changes with bony overgrowth.  Moderate glandular joint degenerative changes.  Humeral head mild flattening and osteophyte formation.  Small ossific structure on Y-view may represent osteophyte rather than a loose body or soft tissue calcification.  Visualized lungs clear.  IMPRESSION: Progressive acromioclavicular joint and glenohumeral joint degenerative changes.  Original Report Authenticated By: Fuller Canada, M.D.    EKG: 07-Mar-2012 05:09:24  Normal sinus rhythm Possible Left atrial enlargement Nonspecific T wave abnormality Abnormal ECG111Vent. rate 76 BPM PR interval 158 ms QRS duration 80 ms QT/QTc 388/436 ms P-R-T axes 62 67 57   Echo: 03/05/2012 Study Conclusions - Left ventricle: The cavity size was normal. Wall thickness was increased in a pattern of mild LVH. Systolic function was normal. The estimated ejection fraction was in the range  of 55% to 60%. There is hypokinesis of the basal-mid inferoposterior myocardium. Doppler parameters are consistent with abnormal left ventricular relaxation (grade 1 diastolic dysfunction). - Mitral valve: Mild regurgitation. - Left atrium: The atrium was mildly dilated.    FOLLOW UP PLANS AND APPOINTMENTS Discharge Orders    Future Appointments: Provider: Department: Dept Phone: Center:   03/16/2012 12:30 PM Beatrice Lecher, PA [facility] [phone] [facility]     Allergies  Allergen Reactions  . Penicillins Swelling and Rash    Patient states only has reaction to liquid penicillins   Medication List  As of 03/07/2012 11:30 AM   TAKE these medications         aspirin 81 MG tablet   Take 1 tablet (81 mg total) by mouth daily.      atorvastatin 20 MG tablet   Commonly known as: LIPITOR   Take 1 tablet (20 mg total) by mouth daily at 6 PM.      clopidogrel 75 MG tablet   Commonly known as: PLAVIX   Take 1 tablet (75 mg total) by mouth daily  with breakfast.      metoprolol tartrate 25 MG tablet   Commonly known as: LOPRESSOR   Take 1 tablet (25 mg total) by mouth 2 (two) times daily.      mulitivitamin with minerals Tabs   Take 1 tablet by mouth daily.      nitroGLYCERIN 0.4 MG SL tablet   Commonly known as: NITROSTAT   Place 1 tablet (0.4 mg total) under the tongue every 5 (five) minutes x 3 doses as needed for chest pain.      OVER THE COUNTER MEDICATION   Take 2 tablets by mouth every 6 (six) hours as needed. Over the counter cold medicine for cold and cough.      oxyCODONE-acetaminophen 5-325 MG per tablet   Commonly known as: PERCOCET   Take 1 tablet by mouth every 4 (four) hours as needed. pain           Follow-up Information    Follow up with Tereso Newcomer, PA. (See for Dr Excell Seltzer on March 28th at 12:30)    Contact information:   [address] [place] [address] [phone]    Follow up with Dr Concepcion Elk as needed.   Call orthopedics - Dr Charlann Boxer, Dr Rennis Chris or Dr Ranell Patrick will see you.    BRING ALL MEDICATIONS WITH YOU TO FOLLOW UP APPOINTMENTS  Time spent with patient to include physician time: 35 min Signed: Theodore Demark 03/07/2012, 11:30 AM Co-Sign MD

## 2012-03-08 LAB — HEPARIN INDUCED THROMBOCYTOPENIA PNL
Heparin Induced Plt Ab: POSITIVE
UFH High Dose UFH H: 1 % Release
UFH Low Dose 0.5 IU/mL: 2 % Release

## 2012-03-08 LAB — HEPATITIS PANEL, ACUTE: Hepatitis B Surface Ag: NEGATIVE

## 2012-03-16 ENCOUNTER — Encounter: Payer: Medicare Other | Admitting: Physician Assistant

## 2012-03-16 ENCOUNTER — Encounter: Payer: Self-pay | Admitting: *Deleted

## 2012-03-27 ENCOUNTER — Telehealth: Payer: Self-pay | Admitting: Orthopedic Surgery

## 2012-04-06 ENCOUNTER — Telehealth: Payer: Self-pay | Admitting: Physician Assistant

## 2012-04-06 NOTE — Telephone Encounter (Signed)
Rec'd staff message from Dr Rennis Chris. His office has tried to contact [name] in order to see him, but they were unable to get in touch. I was also not able to get in touch with him. When he comes to the office next, please update contact info and advise him Dr Rennis Chris is willing to see him, pt should call.

## 2012-08-24 IMAGING — US US ABDOMEN COMPLETE
1 series · 14 of 25 positions shown · non-contrast
Comparison: 08/13/2007.

CLINICAL DATA: Cirrhosis.  Polysubstance abuse.

COMPLETE ABDOMINAL ULTRASOUND

[Series 1: us abdomen complete · 0.28mm/px · 14 of 71 slices shown]
[im 1/71]
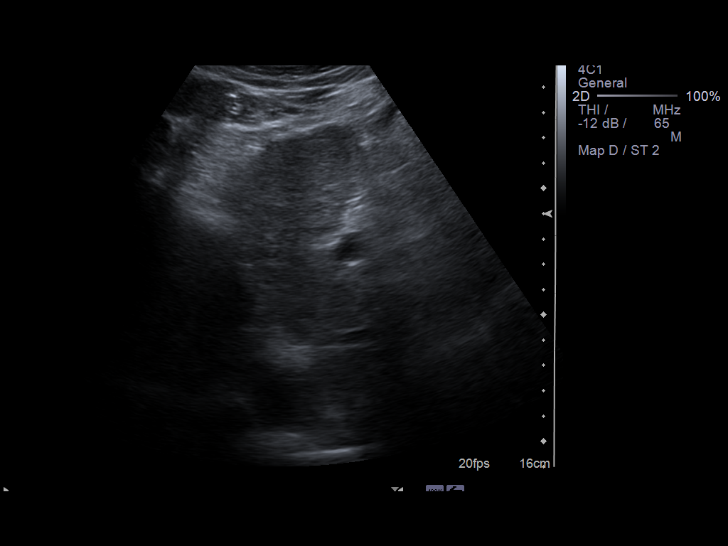
[im 6/71]
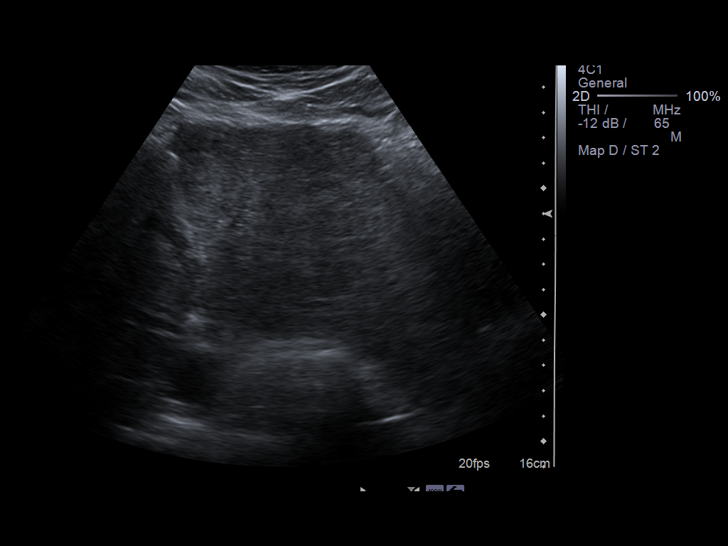
[im 12/71]
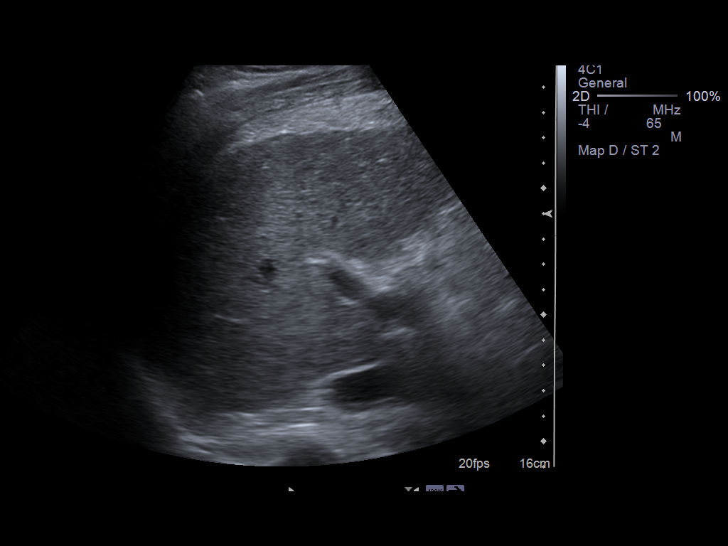
[im 18/71]
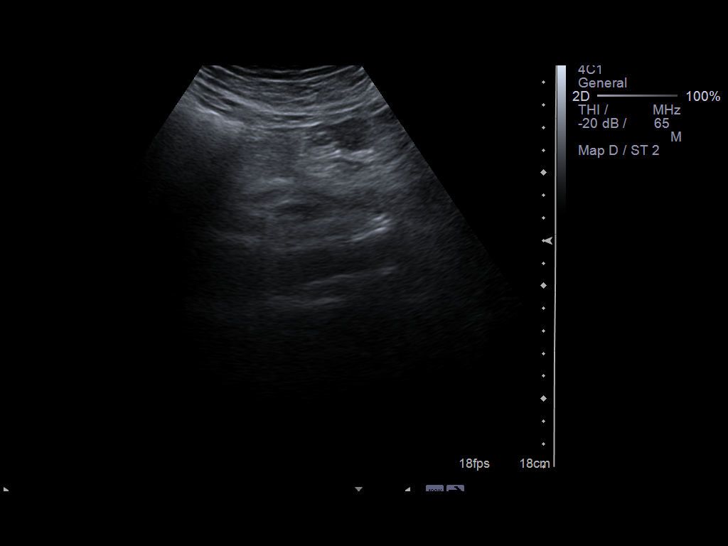
[im 24/71]
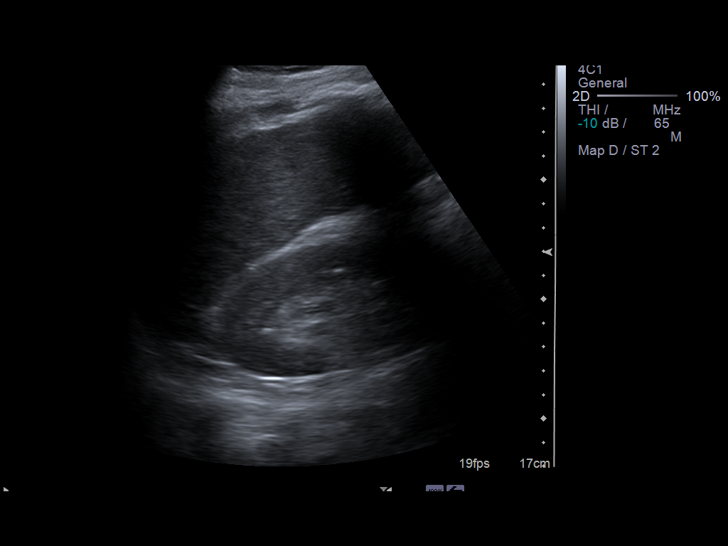
[im 27/71]
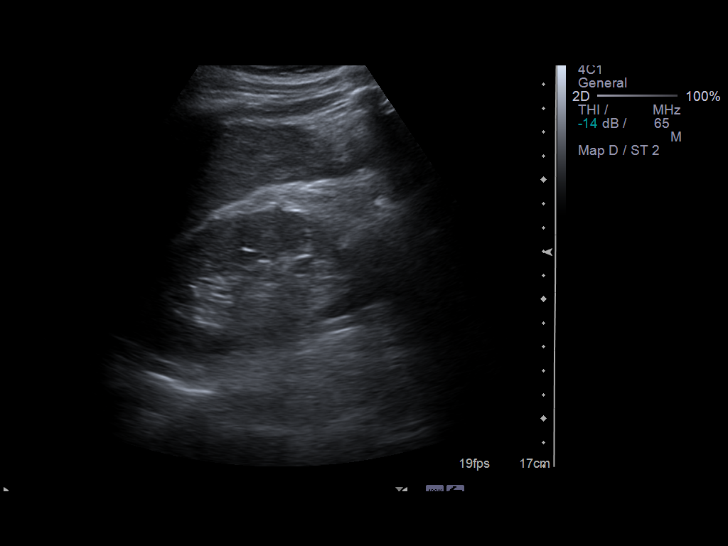
[im 33/71]
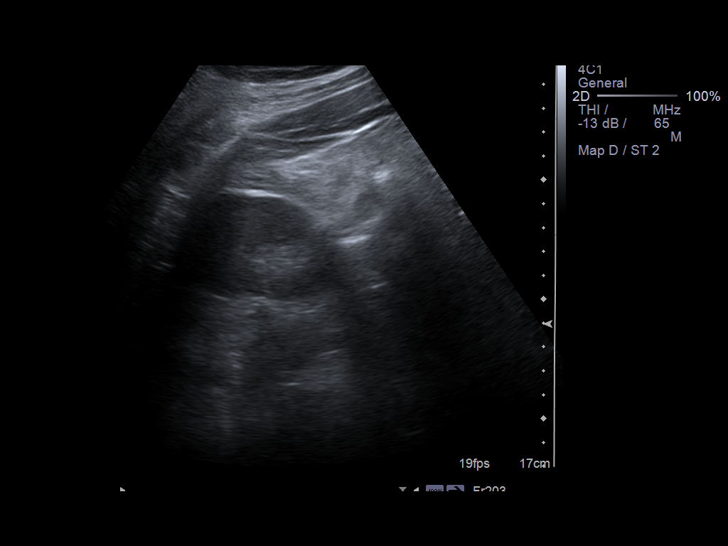
[im 38/71]
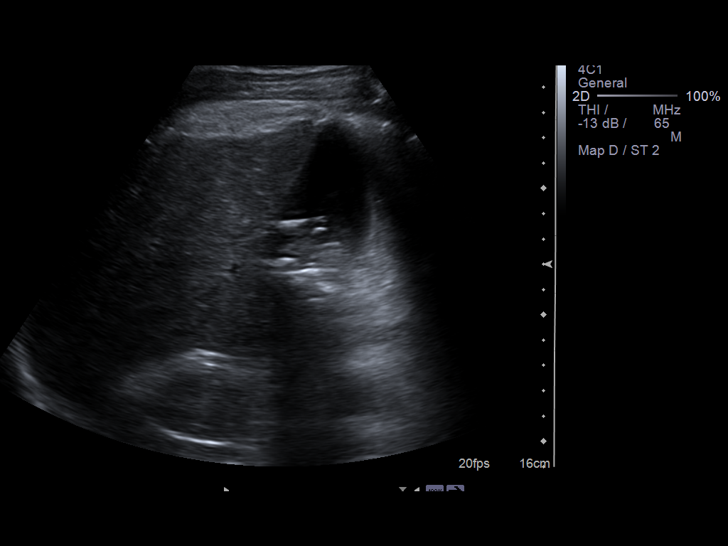
[im 44/71]
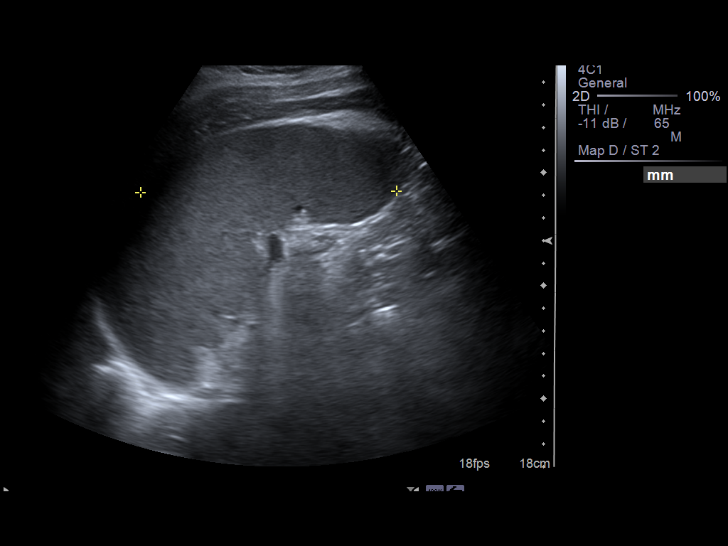
[im 47/71]
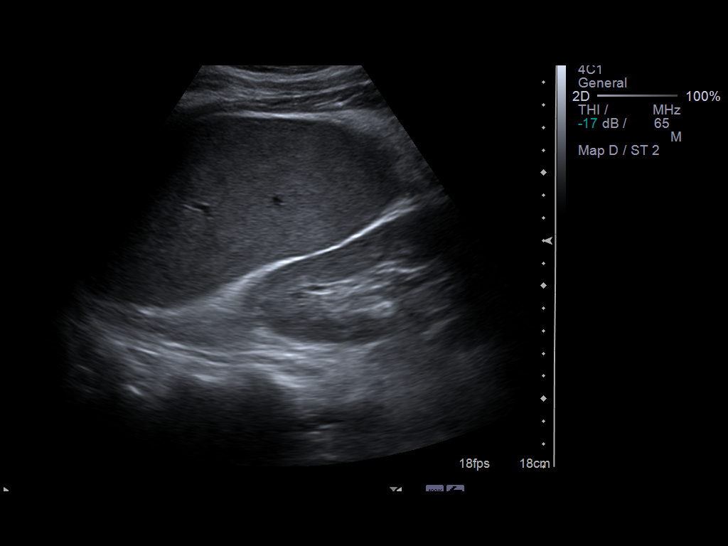
[im 53/71]
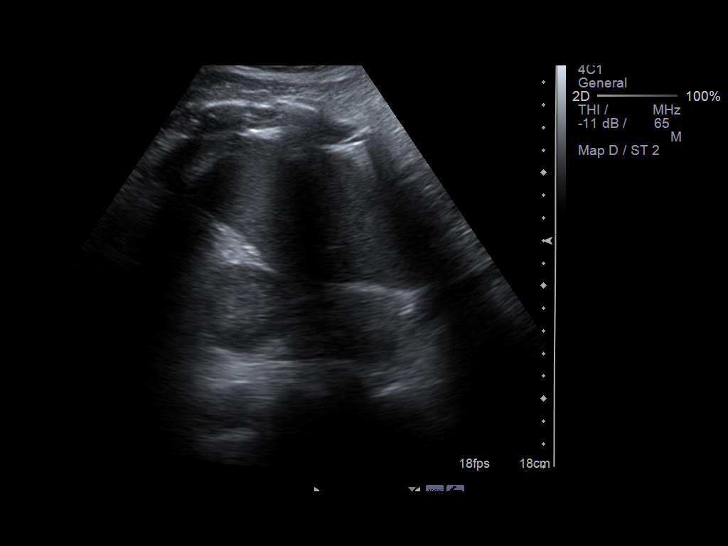
[im 59/71]
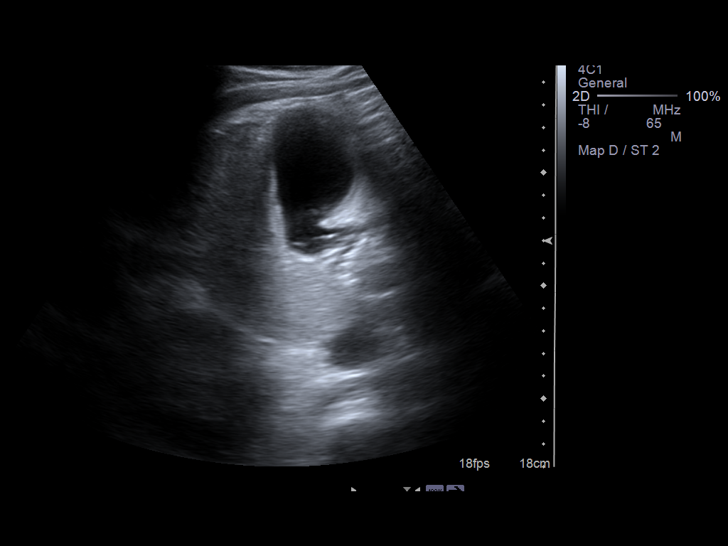
[im 65/71]
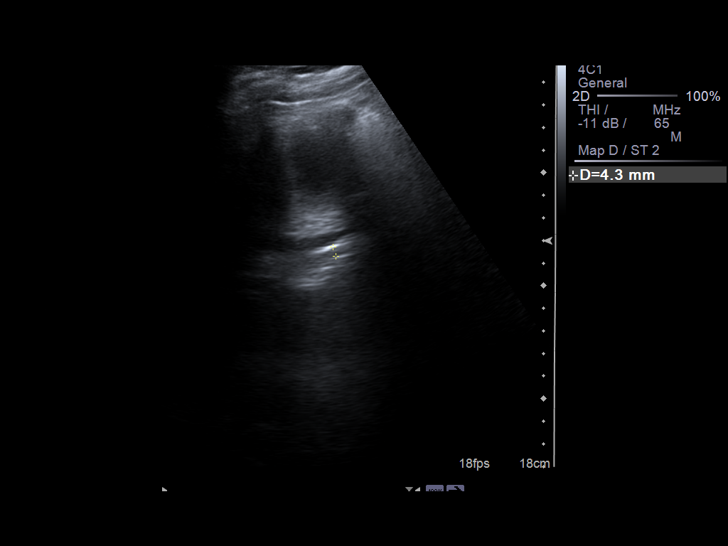
[im 71/71]
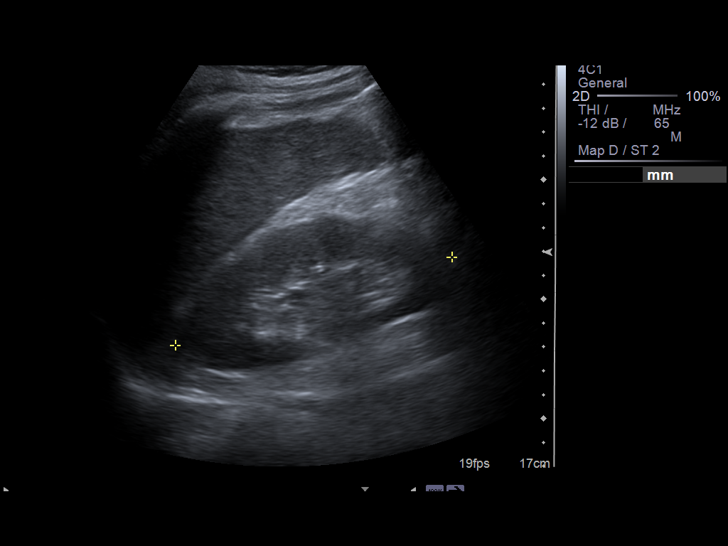

[14 of 25 positions shown; findings below may reference images not displayed]

FINDINGS: Gallbladder:  Multiple small gallstones.  Gallbladder wall measures
up to 2.5 mm.  No pericholecystic fluid.  The patient was not
tender over this region during scanning.

Common bile duct:  4.3 mm.

Liver:  Cirrhotic echogenic liver without discrete mass identified.

IVC:  Poorly delineated secondary to bowel gas

Pancreas:  .  Poorly delineated secondary to bowel gas.

Spleen:  13.8 cm without focal mass.

Right Kidney:  12.9 cm. No hydronephrosis or renal mass..

Left Kidney:  12.4 cm. No hydronephrosis or renal mass..

Abdominal aorta:  Portions not well visualized secondary to
overlying bowel gas.
IMPRESSION: Cirrhosis and splenomegaly.

Multiple gallstones.

Limited evaluation of pancreas and abdominal aorta as well as
inferior vena cava secondary to overlying bowel gas.

## 2013-05-20 DEATH — deceased

## 2014-11-28 ENCOUNTER — Encounter (HOSPITAL_COMMUNITY): Payer: Self-pay | Admitting: Cardiovascular Disease

## 2019-08-09 NOTE — Telephone Encounter (Signed)
This is not my patient or encounter
